# Patient Record
Sex: Male | Born: 1955 | Race: White | Hispanic: No | Smoking: Former smoker
Health system: Southern US, Community
[De-identification: ages and names within clinical notes are randomized; demographics above are authoritative.]

## PROBLEM LIST (undated history)

## (undated) DIAGNOSIS — F329 Major depressive disorder, single episode, unspecified: Secondary | ICD-10-CM

## (undated) DIAGNOSIS — T4145XA Adverse effect of unspecified anesthetic, initial encounter: Secondary | ICD-10-CM

## (undated) DIAGNOSIS — G629 Polyneuropathy, unspecified: Secondary | ICD-10-CM

## (undated) DIAGNOSIS — Z9889 Other specified postprocedural states: Secondary | ICD-10-CM

## (undated) DIAGNOSIS — I10 Essential (primary) hypertension: Secondary | ICD-10-CM

## (undated) DIAGNOSIS — G540 Brachial plexus disorders: Secondary | ICD-10-CM

## (undated) DIAGNOSIS — M1990 Unspecified osteoarthritis, unspecified site: Secondary | ICD-10-CM

## (undated) DIAGNOSIS — F32A Depression, unspecified: Secondary | ICD-10-CM

## (undated) DIAGNOSIS — F419 Anxiety disorder, unspecified: Secondary | ICD-10-CM

## (undated) DIAGNOSIS — I499 Cardiac arrhythmia, unspecified: Secondary | ICD-10-CM

## (undated) DIAGNOSIS — G709 Myoneural disorder, unspecified: Secondary | ICD-10-CM

## (undated) DIAGNOSIS — G4730 Sleep apnea, unspecified: Secondary | ICD-10-CM

## (undated) DIAGNOSIS — T8859XA Other complications of anesthesia, initial encounter: Secondary | ICD-10-CM

## (undated) DIAGNOSIS — R112 Nausea with vomiting, unspecified: Secondary | ICD-10-CM

## (undated) DIAGNOSIS — I4891 Unspecified atrial fibrillation: Secondary | ICD-10-CM

## (undated) DIAGNOSIS — D472 Monoclonal gammopathy: Secondary | ICD-10-CM

## (undated) HISTORY — PX: LAPAROSCOPIC GASTRIC SLEEVE RESECTION: SHX5895

## (undated) HISTORY — PX: CHOLECYSTECTOMY: SHX55

## (undated) HISTORY — DX: Unspecified atrial fibrillation: I48.91

## (undated) HISTORY — PX: COLONOSCOPY: SHX174

## (undated) HISTORY — PX: CERVICAL FUSION: SHX112

## (undated) HISTORY — PX: OTHER SURGICAL HISTORY: SHX169

## (undated) HISTORY — PX: BACK SURGERY: SHX140

## (undated) HISTORY — PX: ACHILLES TENDON REPAIR: SUR1153

## (undated) HISTORY — DX: Brachial plexus disorders: G54.0

## (undated) HISTORY — PX: LAMINECTOMY: SHX219

---
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
## 2012-05-22 DIAGNOSIS — M4800 Spinal stenosis, site unspecified: Secondary | ICD-10-CM | POA: Insufficient documentation

## 2012-05-22 DIAGNOSIS — I10 Essential (primary) hypertension: Secondary | ICD-10-CM | POA: Insufficient documentation

## 2012-05-22 DIAGNOSIS — G4733 Obstructive sleep apnea (adult) (pediatric): Secondary | ICD-10-CM | POA: Insufficient documentation

## 2012-06-09 DIAGNOSIS — I4819 Other persistent atrial fibrillation: Secondary | ICD-10-CM | POA: Insufficient documentation

## 2013-12-23 DIAGNOSIS — M4802 Spinal stenosis, cervical region: Secondary | ICD-10-CM | POA: Insufficient documentation

## 2016-04-09 DIAGNOSIS — Z79899 Other long term (current) drug therapy: Secondary | ICD-10-CM | POA: Insufficient documentation

## 2017-01-10 DIAGNOSIS — G894 Chronic pain syndrome: Secondary | ICD-10-CM | POA: Insufficient documentation

## 2017-12-05 DIAGNOSIS — G8929 Other chronic pain: Secondary | ICD-10-CM | POA: Insufficient documentation

## 2017-12-05 DIAGNOSIS — M25561 Pain in right knee: Secondary | ICD-10-CM | POA: Insufficient documentation

## 2018-01-13 ENCOUNTER — Other Ambulatory Visit: Payer: Self-pay | Admitting: Orthopedic Surgery

## 2018-01-15 ENCOUNTER — Inpatient Hospital Stay (HOSPITAL_COMMUNITY)
Admission: RE | Admit: 2018-01-15 | Discharge: 2018-01-15 | Disposition: A | Discharge status: Home / Self Care | Payer: Self-pay | Source: Ambulatory Visit

## 2018-01-15 NOTE — Pre-Procedure Instructions (Signed)
Cleofas Scadden  01/15/2018      [id number] - [place], [place] [address] AT [address] [place] [address] Phone: [phone] Fax: [phone]    Your procedure is scheduled on 01-27-2018 Monday .  Report to [facility] Admitting at  8:00 A.M.   Call this number if you have problems the morning of surgery:  [phone]   Remember:  Do not eat food or drink liquids after midnight.  Take these medicines the morning of surgery with A SIP OF WATER   Tylenol if needed  Oxycodone if needed for painSTOP TAKING ANY ASPIRIN(UNLESS OTHERWISE INSTRUCTED BY YOUR SURGEON),ANTIINFLAMATORIES (IBUPROFEN,ALEVE,MOTRIN,ADVIL,GOODY'S POWDERS),HERBAL SUPPLEMENTS,FISH OIL,AND VITAMINS 5-7 DAYS PRIOR TO SURGERY      Do not wear jewelry,.  Do not wear lotions, powders,  or deodorant.  Do not shave 48 hours prior to surgery.  Men may shave face and neck.   Do not bring valuables to the hospital.   [facility] is not responsible for any belongings or valuables.  Contacts, dentures or bridgework may not be worn into surgery.  Leave your suitcase in the car.  After surgery it may be brought to your room.  For patients admitted to the hospital, discharge time will be determined by your treatment team.  Patients discharged the day of surgery will not be allowed to drive home.    Special Instructions: [facility] - Preparing for Surgery  Before surgery, you can play an important role.  Because skin is not sterile, your skin needs to be as free of germs as possible.  You can reduce the number of germs on you skin by washing with CHG (chlorahexidine gluconate) soap before surgery.  CHG is an antiseptic cleaner which kills germs and bonds with the skin to continue killing germs even after washing.  Please DO NOT use if you have an allergy to CHG or antibacterial soaps.  If your skin becomes reddened/irritated stop using the CHG  and inform your nurse when you arrive at Short Stay.  Do not shave (including legs and underarms) for at least 48 hours prior to the first CHG shower.  You may shave your face.  Please follow these instructions carefully:   1.  Shower with CHG Soap the night before surgery and the   morning of Surgery.  2.  If you choose to wash your hair, wash your hair first as usual with your normal shampoo.  3.  After you shampoo, rinse your hair and body thoroughly to remove the  Shampoo.  4.  Use CHG as you would any other liquid soap.  You can apply chg directly  to the skin and wash gently with scrungie or a clean washcloth.  5.  Apply the CHG Soap to your body ONLY FROM THE NECK DOWN.   Do not use on open wounds or open sores.  Avoid contact with your eyes,  ears, mouth and genitals (private parts).  Wash genitals (private parts) with your normal soap.  6.  Wash thoroughly, paying special attention to the area where your surgery will be performed.  7.  Thoroughly rinse your body with warm water from the neck down.  8.  DO NOT shower/wash with your normal soap after using and rinsing o  the CHG Soap.  9.  Pat yourself dry with a clean towel.            10.  Wear clean pajamas.  11.  Place clean sheets on your bed the night of your first shower and do not sleep with pets.  Day of Surgery  Do not apply any lotions/deodorants the morning of surgery.  Please wear clean clothes to the hospital/surgery center.   Please read over the following fact sheets that you were given. MRSA Information and Surgical Site Infection Prevention,Incentive Spirometry

## 2018-01-16 ENCOUNTER — Other Ambulatory Visit: Payer: Self-pay

## 2018-01-16 ENCOUNTER — Encounter (HOSPITAL_COMMUNITY): Payer: Self-pay

## 2018-01-16 ENCOUNTER — Encounter (HOSPITAL_COMMUNITY)
Admission: RE | Admit: 2018-01-16 | Discharge: 2018-01-16 | Disposition: A | Discharge status: Home / Self Care | Payer: Medicare HMO | Source: Ambulatory Visit | Attending: Orthopedic Surgery | Admitting: Orthopedic Surgery

## 2018-01-16 DIAGNOSIS — Z01812 Encounter for preprocedural laboratory examination: Secondary | ICD-10-CM | POA: Diagnosis not present

## 2018-01-16 HISTORY — DX: Nausea with vomiting, unspecified: Z98.890

## 2018-01-16 HISTORY — DX: Cardiac arrhythmia, unspecified: I49.9

## 2018-01-16 HISTORY — DX: Depression, unspecified: F32.A

## 2018-01-16 HISTORY — DX: Essential (primary) hypertension: I10

## 2018-01-16 HISTORY — DX: Myoneural disorder, unspecified: G70.9

## 2018-01-16 HISTORY — DX: Unspecified osteoarthritis, unspecified site: M19.90

## 2018-01-16 HISTORY — DX: Sleep apnea, unspecified: G47.30

## 2018-01-16 HISTORY — DX: Major depressive disorder, single episode, unspecified: F32.9

## 2018-01-16 HISTORY — DX: Adverse effect of unspecified anesthetic, initial encounter: T41.45XA

## 2018-01-16 HISTORY — DX: Anxiety disorder, unspecified: F41.9

## 2018-01-16 HISTORY — DX: Other complications of anesthesia, initial encounter: T88.59XA

## 2018-01-16 HISTORY — DX: Nausea with vomiting, unspecified: R11.2

## 2018-01-16 LAB — COMPREHENSIVE METABOLIC PANEL
ALT: 28 U/L (ref 17–63)
AST: 32 U/L (ref 15–41)
Albumin: 3.9 g/dL (ref 3.5–5.0)
Alkaline Phosphatase: 54 U/L (ref 38–126)
Anion gap: 8 (ref 5–15)
BUN: 14 mg/dL (ref 6–20)
CALCIUM: 8.9 mg/dL (ref 8.9–10.3)
CO2: 29 mmol/L (ref 22–32)
Chloride: 102 mmol/L (ref 101–111)
Creatinine, Ser: 0.8 mg/dL (ref 0.61–1.24)
GFR calc non Af Amer: >60 mL/min (ref >60)
Glucose, Bld: 93 mg/dL (ref 65–99)
Potassium: 3.4 mmol/L — ABNORMAL LOW (ref 3.5–5.1)
SODIUM: 139 mmol/L (ref 135–145)
Total Bilirubin: 0.5 mg/dL (ref 0.3–1.2)
Total Protein: 7.8 g/dL (ref 6.5–8.1)

## 2018-01-16 LAB — CBC WITH DIFFERENTIAL/PLATELET
Basophils Absolute: 0 10*3/uL (ref 0.0–0.1)
Basophils Relative: 0 %
EOS ABS: 0.3 10*3/uL (ref 0.0–0.7)
Eosinophils Relative: 3 %
HCT: 42.8 % (ref 39.0–52.0)
Hemoglobin: 15 g/dL (ref 13.0–17.0)
LYMPHS ABS: 2.1 10*3/uL (ref 0.7–4.0)
Lymphocytes Relative: 25 %
MCH: 32.5 pg (ref 26.0–34.0)
MCHC: 35 g/dL (ref 30.0–36.0)
MCV: 92.8 fL (ref 78.0–100.0)
Monocytes Absolute: 0.7 10*3/uL (ref 0.1–1.0)
Monocytes Relative: 8 %
NEUTROS PCT: 64 %
Neutro Abs: 5.6 10*3/uL (ref 1.7–7.7)
Platelets: 257 10*3/uL (ref 150–400)
RBC: 4.61 MIL/uL (ref 4.22–5.81)
RDW: 12.2 % (ref 11.5–15.5)
WBC: 8.7 10*3/uL (ref 4.0–10.5)

## 2018-01-16 LAB — SURGICAL PCR SCREEN
MRSA, PCR: NEGATIVE
STAPHYLOCOCCUS AUREUS: NEGATIVE

## 2018-01-16 NOTE — Progress Notes (Signed)
PCP  Daryll Brod  MD   Cardiologist   Tally Due    MD  [facility] Has not seen in quite awhile per patient  Had Ablation for Atrial Fib,no problem since  Uses CPAP last sleep study greater than 5 years ago  No TED hose that will fit patient,message sent to Dr. Ronnie Derby

## 2018-01-17 NOTE — Progress Notes (Signed)
Case:  [id number] Date/Time:  01/27/18 0945   Procedure:  TOTAL KNEE ARTHROPLASTY (Left )   Anesthesia type:  Spinal   Pre-op diagnosis:  primary osteoarthritis left knee   Location:  [facility] OR ROOM 06 / [place] OR   Surgeon:  Vickey Huger, MD      DISCUSSION: Pt is a 62 year old male with hx afib s/p ablation (2013) with no reported recurrence   VS: BP (!) 141/80   Pulse 73   Temp 36.8 C   Resp 20   Ht 6\' 2"  (1.88 m)   Wt (!) 301 lb 14.4 oz (136.9 kg)   SpO2 95%   BMI 38.76 kg/m    PROVIDERS: Sun, Albert Yuan Yen, MD Cardiologist was Tally Due, MD in [facility].  Pt has not seen cardiology since afib ablation 2013 (notes in care everywhere)    LABS: Labs reviewed: Acceptable for surgery. (all labs ordered are listed, but only abnormal results are displayed)  Labs Reviewed  COMPREHENSIVE METABOLIC PANEL - Abnormal; Notable for the following components:      Result Value   Potassium 3.4 (*)    All other components within normal limits  SURGICAL PCR SCREEN  CBC WITH DIFFERENTIAL/PLATELET     EKG 01/16/18: NSR. LAD. RBBB. Inferior infarct, age undetermined. Appears stable when compared to prior EKG 07/05/09 (from [facility])   CV:  Echo 07/03/12 (care everywhere):  - There is trace mitral regurgitation. - There is physiologic tricuspid regurgitation. - There is no significant aortic or pulmonic regurgitation. - The estimated peak right ventricular systolic pressure is 31 mmHg based on an estimated or assumed right atrial pressure of 48mmHg. - Stenosis of the aortic, mitral, tricuspid, or pulmonic valves is not identified. - By Doppler color flow there are no intracardiac shunts identified. - Diastolic function of the left ventricle is indeterminate.   Past Medical History:  Diagnosis Date  . Anxiety   . Arthritis   . Complication of anesthesia   . Depression   . Dysrhythmia    a-Fib    ablation  . Hypertension   . Neuromuscular disorder (HCC)    neuropathy  both  legs and feet  . PONV (postoperative nausea and vomiting)    Severe  nausea  . Sleep apnea    CPAP     Past Surgical History:  Procedure Laterality Date  . ablation for atrial fib    . ACHILLES TENDON REPAIR    . arthroscopic knee Bilateral   . BACK SURGERY     times four  . CERVICAL FUSION    . LAPAROSCOPIC GASTRIC SLEEVE RESECTION      MEDICATIONS: . acetaminophen (TYLENOL) 500 MG tablet  . amLODipine (NORVASC) 5 MG tablet  . diclofenac (VOLTAREN) 75 MG EC tablet  . lisinopril-hydrochlorothiazide (PRINZIDE,ZESTORETIC) 20-12.5 MG tablet  . Oxycodone HCl 10 MG TABS  . predniSONE (DELTASONE) 5 MG tablet   No current facility-administered medications for this encounter.     If no changes, I anticipate pt can proceed with surgery as scheduled.   Willeen Cass, FNP-BC [facility] Short Stay Surgical Center/Anesthesiology Phone: [phone] 01/20/2018 10:20 AM

## 2018-01-24 MED ORDER — TRANEXAMIC ACID 1000 MG/10ML IV SOLN
1000.0000 mg | INTRAVENOUS | Status: AC
  Administered 2018-01-27: 1000 mg via INTRAVENOUS
  Filled 2018-01-24: qty 1100

## 2018-01-24 MED ORDER — BUPIVACAINE LIPOSOME 1.3 % IJ SUSP
20.0000 mL | INTRAMUSCULAR | Status: DC
  Filled 2018-01-24 (×2): qty 20

## 2018-01-24 MED ORDER — DEXTROSE 5 % IV SOLN
3.0000 g | INTRAVENOUS | Status: AC
  Administered 2018-01-27: 3 g via INTRAVENOUS
  Filled 2018-01-24: qty 3

## 2018-01-27 ENCOUNTER — Encounter (HOSPITAL_COMMUNITY)
Admission: AD | Disposition: A | Discharge status: Home / Self Care | Payer: Self-pay | Source: Ambulatory Visit | Attending: Orthopedic Surgery

## 2018-01-27 ENCOUNTER — Ambulatory Visit (HOSPITAL_COMMUNITY): Payer: Medicare HMO | Admitting: Emergency Medicine

## 2018-01-27 ENCOUNTER — Ambulatory Visit (HOSPITAL_COMMUNITY)
Admission: AD | Admit: 2018-01-27 | Discharge: 2018-01-29 | Disposition: A | Discharge status: Home / Self Care | Payer: Medicare HMO | Source: Ambulatory Visit | Attending: Orthopedic Surgery | Admitting: Orthopedic Surgery

## 2018-01-27 ENCOUNTER — Encounter (HOSPITAL_COMMUNITY): Payer: Self-pay | Admitting: Certified Registered"

## 2018-01-27 DIAGNOSIS — Z9989 Dependence on other enabling machines and devices: Secondary | ICD-10-CM | POA: Insufficient documentation

## 2018-01-27 DIAGNOSIS — Z885 Allergy status to narcotic agent status: Secondary | ICD-10-CM | POA: Insufficient documentation

## 2018-01-27 DIAGNOSIS — F329 Major depressive disorder, single episode, unspecified: Secondary | ICD-10-CM | POA: Diagnosis not present

## 2018-01-27 DIAGNOSIS — I10 Essential (primary) hypertension: Secondary | ICD-10-CM | POA: Insufficient documentation

## 2018-01-27 DIAGNOSIS — Z6838 Body mass index (BMI) 38.0-38.9, adult: Secondary | ICD-10-CM | POA: Insufficient documentation

## 2018-01-27 DIAGNOSIS — G4730 Sleep apnea, unspecified: Secondary | ICD-10-CM | POA: Diagnosis not present

## 2018-01-27 DIAGNOSIS — F17210 Nicotine dependence, cigarettes, uncomplicated: Secondary | ICD-10-CM | POA: Diagnosis not present

## 2018-01-27 DIAGNOSIS — I4891 Unspecified atrial fibrillation: Secondary | ICD-10-CM | POA: Diagnosis not present

## 2018-01-27 DIAGNOSIS — Z79899 Other long term (current) drug therapy: Secondary | ICD-10-CM | POA: Insufficient documentation

## 2018-01-27 DIAGNOSIS — F419 Anxiety disorder, unspecified: Secondary | ICD-10-CM | POA: Insufficient documentation

## 2018-01-27 DIAGNOSIS — M1712 Unilateral primary osteoarthritis, left knee: Secondary | ICD-10-CM | POA: Diagnosis not present

## 2018-01-27 DIAGNOSIS — G5783 Other specified mononeuropathies of bilateral lower limbs: Secondary | ICD-10-CM | POA: Diagnosis not present

## 2018-01-27 DIAGNOSIS — E669 Obesity, unspecified: Secondary | ICD-10-CM | POA: Diagnosis not present

## 2018-01-27 DIAGNOSIS — Z7982 Long term (current) use of aspirin: Secondary | ICD-10-CM | POA: Diagnosis not present

## 2018-01-27 DIAGNOSIS — Z96659 Presence of unspecified artificial knee joint: Secondary | ICD-10-CM

## 2018-01-27 HISTORY — PX: TOTAL KNEE ARTHROPLASTY: SHX125

## 2018-01-27 SURGERY — ARTHROPLASTY, KNEE, TOTAL
Anesthesia: General | Site: Knee | Laterality: Left

## 2018-01-27 MED ORDER — CHLORHEXIDINE GLUCONATE 4 % EX LIQD: 60.0000 mL | Freq: Once | CUTANEOUS | Status: DC

## 2018-01-27 MED ORDER — MIDAZOLAM HCL 2 MG/2ML IJ SOLN
INTRAMUSCULAR | Status: DC | PRN
  Administered 2018-01-27: 2 mg via INTRAVENOUS

## 2018-01-27 MED ORDER — LISINOPRIL 20 MG PO TABS
20.0000 mg | ORAL_TABLET | Freq: Every day | ORAL | Status: DC
  Administered 2018-01-28 – 2018-01-29 (×2): 20 mg via ORAL
  Filled 2018-01-27 (×2): qty 1

## 2018-01-27 MED ORDER — GABAPENTIN 300 MG PO CAPS
300.0000 mg | ORAL_CAPSULE | Freq: Three times a day (TID) | ORAL | Status: DC
  Administered 2018-01-27 – 2018-01-29 (×7): 300 mg via ORAL
  Filled 2018-01-27 (×7): qty 1

## 2018-01-27 MED ORDER — BISACODYL 5 MG PO TBEC: 5.0000 mg | DELAYED_RELEASE_TABLET | Freq: Every day | ORAL | Status: DC | PRN

## 2018-01-27 MED ORDER — PROPOFOL 10 MG/ML IV BOLUS
INTRAVENOUS | Status: AC
  Filled 2018-01-27: qty 20

## 2018-01-27 MED ORDER — FLEET ENEMA 7-19 GM/118ML RE ENEM: 1.0000 | ENEMA | Freq: Once | RECTAL | Status: DC | PRN

## 2018-01-27 MED ORDER — METOCLOPRAMIDE HCL 5 MG/ML IJ SOLN: 5.0000 mg | Freq: Three times a day (TID) | INTRAMUSCULAR | Status: DC | PRN

## 2018-01-27 MED ORDER — BUPIVACAINE-EPINEPHRINE (PF) 0.25% -1:200000 IJ SOLN: INTRAMUSCULAR | Status: DC | PRN

## 2018-01-27 MED ORDER — BUPIVACAINE LIPOSOME 1.3 % IJ SUSP
INTRAMUSCULAR | Status: DC | PRN
  Administered 2018-01-27: 20 mL

## 2018-01-27 MED ORDER — METOCLOPRAMIDE HCL 5 MG PO TABS: 5.0000 mg | ORAL_TABLET | Freq: Three times a day (TID) | ORAL | Status: DC | PRN

## 2018-01-27 MED ORDER — AMLODIPINE BESYLATE 5 MG PO TABS
5.0000 mg | ORAL_TABLET | Freq: Every day | ORAL | Status: DC
  Administered 2018-01-27 – 2018-01-28 (×2): 5 mg via ORAL
  Filled 2018-01-27 (×2): qty 1

## 2018-01-27 MED ORDER — MENTHOL 3 MG MT LOZG: 1.0000 | LOZENGE | OROMUCOSAL | Status: DC | PRN

## 2018-01-27 MED ORDER — OXYCODONE HCL 5 MG PO TABS
ORAL_TABLET | ORAL | Status: AC
  Filled 2018-01-27: qty 2

## 2018-01-27 MED ORDER — METHOCARBAMOL 500 MG PO TABS
500.0000 mg | ORAL_TABLET | Freq: Four times a day (QID) | ORAL | Status: DC | PRN
  Administered 2018-01-27 – 2018-01-28 (×4): 500 mg via ORAL
  Filled 2018-01-27 (×4): qty 1

## 2018-01-27 MED ORDER — SODIUM CHLORIDE 0.9 % IR SOLN
Status: DC | PRN
  Administered 2018-01-27: 1000 mL
  Administered 2018-01-27: 3000 mL

## 2018-01-27 MED ORDER — HYDROMORPHONE HCL 2 MG/ML IJ SOLN
INTRAMUSCULAR | Status: AC
  Filled 2018-01-27: qty 1

## 2018-01-27 MED ORDER — LACTATED RINGERS IV SOLN
INTRAVENOUS | Status: DC
  Administered 2018-01-27: 10 mL/h via INTRAVENOUS

## 2018-01-27 MED ORDER — FENTANYL CITRATE (PF) 250 MCG/5ML IJ SOLN
INTRAMUSCULAR | Status: AC
  Filled 2018-01-27: qty 5

## 2018-01-27 MED ORDER — ONDANSETRON HCL 4 MG/2ML IJ SOLN
INTRAMUSCULAR | Status: AC
  Filled 2018-01-27: qty 2

## 2018-01-27 MED ORDER — PROPOFOL 1000 MG/100ML IV EMUL
INTRAVENOUS | Status: AC
  Filled 2018-01-27: qty 100

## 2018-01-27 MED ORDER — METHYLNALTREXONE BROMIDE 150 MG PO TABS: 150.0000 mg | ORAL_TABLET | Freq: Every day | ORAL | Status: DC

## 2018-01-27 MED ORDER — TRANEXAMIC ACID 1000 MG/10ML IV SOLN
1000.0000 mg | Freq: Once | INTRAVENOUS | Status: AC
  Administered 2018-01-27: 1000 mg via INTRAVENOUS
  Filled 2018-01-27: qty 10

## 2018-01-27 MED ORDER — ONDANSETRON HCL 4 MG/2ML IJ SOLN
INTRAMUSCULAR | Status: DC | PRN
  Administered 2018-01-27: 4 mg via INTRAVENOUS

## 2018-01-27 MED ORDER — ONDANSETRON HCL 4 MG/2ML IJ SOLN: 4.0000 mg | Freq: Once | INTRAMUSCULAR | Status: DC | PRN

## 2018-01-27 MED ORDER — DEXAMETHASONE SODIUM PHOSPHATE 10 MG/ML IJ SOLN
INTRAMUSCULAR | Status: AC
  Filled 2018-01-27: qty 1

## 2018-01-27 MED ORDER — PREDNISONE 5 MG PO TABS
5.0000 mg | ORAL_TABLET | Freq: Every day | ORAL | Status: DC
  Administered 2018-01-28 – 2018-01-29 (×2): 5 mg via ORAL
  Filled 2018-01-27 (×2): qty 1

## 2018-01-27 MED ORDER — DEXAMETHASONE SODIUM PHOSPHATE 10 MG/ML IJ SOLN
10.0000 mg | Freq: Once | INTRAMUSCULAR | Status: AC
  Administered 2018-01-28: 10 mg via INTRAVENOUS
  Filled 2018-01-27: qty 1

## 2018-01-27 MED ORDER — MIDAZOLAM HCL 2 MG/2ML IJ SOLN
INTRAMUSCULAR | Status: AC
  Filled 2018-01-27: qty 2

## 2018-01-27 MED ORDER — FENTANYL CITRATE (PF) 100 MCG/2ML IJ SOLN
50.0000 ug | Freq: Once | INTRAMUSCULAR | Status: AC
  Administered 2018-01-27: 50 ug via INTRAVENOUS

## 2018-01-27 MED ORDER — CEFAZOLIN SODIUM-DEXTROSE 2-4 GM/100ML-% IV SOLN
2.0000 g | Freq: Four times a day (QID) | INTRAVENOUS | Status: AC
  Administered 2018-01-27 (×2): 2 g via INTRAVENOUS
  Filled 2018-01-27 (×2): qty 100

## 2018-01-27 MED ORDER — ALUM & MAG HYDROXIDE-SIMETH 200-200-20 MG/5ML PO SUSP
30.0000 mL | ORAL | Status: DC | PRN
  Administered 2018-01-28 – 2018-01-29 (×4): 30 mL via ORAL
  Filled 2018-01-27 (×4): qty 30

## 2018-01-27 MED ORDER — ONDANSETRON HCL 4 MG PO TABS
4.0000 mg | ORAL_TABLET | Freq: Four times a day (QID) | ORAL | Status: DC | PRN
Start: 2018-01-27 — End: 2018-01-29

## 2018-01-27 MED ORDER — ROCURONIUM BROMIDE 10 MG/ML (PF) SYRINGE
PREFILLED_SYRINGE | INTRAVENOUS | Status: AC
  Filled 2018-01-27: qty 5

## 2018-01-27 MED ORDER — ACETAMINOPHEN 500 MG PO TABS
1000.0000 mg | ORAL_TABLET | Freq: Four times a day (QID) | ORAL | Status: AC
  Administered 2018-01-27 – 2018-01-28 (×4): 1000 mg via ORAL
  Filled 2018-01-27 (×4): qty 2

## 2018-01-27 MED ORDER — MIDAZOLAM HCL 2 MG/2ML IJ SOLN
2.0000 mg | Freq: Once | INTRAMUSCULAR | Status: AC
  Administered 2018-01-27: 2 mg via INTRAVENOUS

## 2018-01-27 MED ORDER — ONDANSETRON HCL 4 MG/2ML IJ SOLN
4.0000 mg | Freq: Four times a day (QID) | INTRAMUSCULAR | Status: DC | PRN
  Administered 2018-01-27: 4 mg via INTRAVENOUS
  Filled 2018-01-27: qty 2

## 2018-01-27 MED ORDER — HYDROCHLOROTHIAZIDE 12.5 MG PO CAPS
12.5000 mg | ORAL_CAPSULE | Freq: Every day | ORAL | Status: DC
  Administered 2018-01-28 – 2018-01-29 (×2): 12.5 mg via ORAL
  Filled 2018-01-27 (×2): qty 1

## 2018-01-27 MED ORDER — ZOLPIDEM TARTRATE 5 MG PO TABS
5.0000 mg | ORAL_TABLET | Freq: Every evening | ORAL | Status: DC | PRN
  Filled 2018-01-27: qty 1

## 2018-01-27 MED ORDER — ACETAMINOPHEN 500 MG PO TABS
1000.0000 mg | ORAL_TABLET | Freq: Once | ORAL | Status: AC
  Administered 2018-01-27: 1000 mg via ORAL
  Filled 2018-01-27: qty 2

## 2018-01-27 MED ORDER — DOCUSATE SODIUM 100 MG PO CAPS
100.0000 mg | ORAL_CAPSULE | Freq: Two times a day (BID) | ORAL | Status: DC
  Administered 2018-01-27 – 2018-01-29 (×4): 100 mg via ORAL
  Filled 2018-01-27 (×4): qty 1

## 2018-01-27 MED ORDER — LIDOCAINE 2% (20 MG/ML) 5 ML SYRINGE
INTRAMUSCULAR | Status: DC | PRN
  Administered 2018-01-27: 100 mg via INTRAVENOUS

## 2018-01-27 MED ORDER — BUPIVACAINE-EPINEPHRINE 0.5% -1:200000 IJ SOLN
INTRAMUSCULAR | Status: DC | PRN
  Administered 2018-01-27: 30 mL

## 2018-01-27 MED ORDER — LIDOCAINE 2% (20 MG/ML) 5 ML SYRINGE
INTRAMUSCULAR | Status: AC
  Filled 2018-01-27: qty 5

## 2018-01-27 MED ORDER — METHOCARBAMOL 500 MG PO TABS
ORAL_TABLET | ORAL | Status: AC
  Filled 2018-01-27: qty 1

## 2018-01-27 MED ORDER — SUGAMMADEX SODIUM 200 MG/2ML IV SOLN
INTRAVENOUS | Status: DC | PRN
  Administered 2018-01-27: 273 mg via INTRAVENOUS

## 2018-01-27 MED ORDER — PROPOFOL 10 MG/ML IV BOLUS
INTRAVENOUS | Status: DC | PRN
  Administered 2018-01-27: 150 mg via INTRAVENOUS
  Administered 2018-01-27: 50 mg via INTRAVENOUS

## 2018-01-27 MED ORDER — PANTOPRAZOLE SODIUM 40 MG PO TBEC
40.0000 mg | DELAYED_RELEASE_TABLET | Freq: Every day | ORAL | Status: DC
  Administered 2018-01-28 – 2018-01-29 (×2): 40 mg via ORAL
  Filled 2018-01-27 (×2): qty 1

## 2018-01-27 MED ORDER — PROPOFOL 500 MG/50ML IV EMUL
INTRAVENOUS | Status: DC | PRN
  Administered 2018-01-27: 100 ug/kg/min via INTRAVENOUS

## 2018-01-27 MED ORDER — DIPHENHYDRAMINE HCL 12.5 MG/5ML PO ELIX: 12.5000 mg | ORAL_SOLUTION | ORAL | Status: DC | PRN

## 2018-01-27 MED ORDER — MEPERIDINE HCL 50 MG/ML IJ SOLN: 6.2500 mg | INTRAMUSCULAR | Status: DC | PRN

## 2018-01-27 MED ORDER — LISINOPRIL-HYDROCHLOROTHIAZIDE 20-12.5 MG PO TABS: 1.0000 | ORAL_TABLET | Freq: Two times a day (BID) | ORAL | Status: DC

## 2018-01-27 MED ORDER — ASPIRIN EC 325 MG PO TBEC
325.0000 mg | DELAYED_RELEASE_TABLET | Freq: Two times a day (BID) | ORAL | Status: DC
  Administered 2018-01-27 – 2018-01-29 (×4): 325 mg via ORAL
  Filled 2018-01-27 (×4): qty 1

## 2018-01-27 MED ORDER — GABAPENTIN 300 MG PO CAPS
300.0000 mg | ORAL_CAPSULE | Freq: Once | ORAL | Status: AC
  Administered 2018-01-27: 300 mg via ORAL
  Filled 2018-01-27: qty 1

## 2018-01-27 MED ORDER — HYDROMORPHONE HCL 2 MG/ML IJ SOLN
0.5000 mg | INTRAMUSCULAR | Status: DC | PRN
  Administered 2018-01-27 – 2018-01-28 (×5): 1 mg via INTRAVENOUS
  Filled 2018-01-27 (×5): qty 1

## 2018-01-27 MED ORDER — OXYCODONE HCL 5 MG PO TABS
10.0000 mg | ORAL_TABLET | ORAL | Status: DC | PRN
  Administered 2018-01-27 (×2): 15 mg via ORAL
  Administered 2018-01-27 – 2018-01-28 (×2): 10 mg via ORAL
  Administered 2018-01-28 – 2018-01-29 (×7): 15 mg via ORAL
  Filled 2018-01-27 (×10): qty 3

## 2018-01-27 MED ORDER — METHOCARBAMOL 1000 MG/10ML IJ SOLN
500.0000 mg | Freq: Four times a day (QID) | INTRAVENOUS | Status: DC | PRN
  Filled 2018-01-27: qty 5

## 2018-01-27 MED ORDER — SODIUM CHLORIDE 0.9 % IJ SOLN
INTRAMUSCULAR | Status: DC | PRN
  Administered 2018-01-27: 20 mL

## 2018-01-27 MED ORDER — PHENOL 1.4 % MT LIQD: 1.0000 | OROMUCOSAL | Status: DC | PRN

## 2018-01-27 MED ORDER — HYDROMORPHONE HCL 2 MG/ML IJ SOLN
0.2500 mg | INTRAMUSCULAR | Status: DC | PRN
  Administered 2018-01-27 (×4): 0.5 mg via INTRAVENOUS

## 2018-01-27 MED ORDER — DEXAMETHASONE SODIUM PHOSPHATE 10 MG/ML IJ SOLN
8.0000 mg | Freq: Once | INTRAMUSCULAR | Status: AC
  Administered 2018-01-27: 8 mg via INTRAVENOUS
  Filled 2018-01-27: qty 1

## 2018-01-27 MED ORDER — SENNOSIDES-DOCUSATE SODIUM 8.6-50 MG PO TABS: 1.0000 | ORAL_TABLET | Freq: Every evening | ORAL | Status: DC | PRN

## 2018-01-27 MED ORDER — ROPIVACAINE HCL 7.5 MG/ML IJ SOLN
INTRAMUSCULAR | Status: DC | PRN
  Administered 2018-01-27 (×2): 20 mL via PERINEURAL

## 2018-01-27 MED ORDER — FENTANYL CITRATE (PF) 100 MCG/2ML IJ SOLN
INTRAMUSCULAR | Status: DC | PRN
  Administered 2018-01-27: 50 ug via INTRAVENOUS
  Administered 2018-01-27: 250 ug via INTRAVENOUS
  Administered 2018-01-27: 100 ug via INTRAVENOUS

## 2018-01-27 MED ORDER — FENTANYL CITRATE (PF) 100 MCG/2ML IJ SOLN
INTRAMUSCULAR | Status: AC
  Filled 2018-01-27: qty 2

## 2018-01-27 MED ORDER — ROCURONIUM BROMIDE 10 MG/ML (PF) SYRINGE
PREFILLED_SYRINGE | INTRAVENOUS | Status: DC | PRN
  Administered 2018-01-27: 20 mg via INTRAVENOUS
  Administered 2018-01-27: 50 mg via INTRAVENOUS

## 2018-01-27 SURGICAL SUPPLY — 64 items
BANDAGE ACE 6X5 VEL STRL LF (GAUZE/BANDAGES/DRESSINGS) ×2 IMPLANT
BANDAGE ESMARK 6X9 LF (GAUZE/BANDAGES/DRESSINGS) ×1 IMPLANT
BLADE SAGITTAL 13X1.27X60 (BLADE) ×2 IMPLANT
BLADE SAW SGTL 83.5X18.5 (BLADE) ×2 IMPLANT
BLADE SURG 10 STRL SS (BLADE) ×6 IMPLANT
BNDG ESMARK 6X9 LF (GAUZE/BANDAGES/DRESSINGS) ×2
BOWL SMART MIX CTS (DISPOSABLE) ×2 IMPLANT
CEMENT BONE SIMPLEX SPEEDSET (Cement) ×4 IMPLANT
COMP FEM PERSONA STD SZ12 LT (Knees) ×2 IMPLANT
COMPONENT FEM PRNSA STD SZ12LT (Knees) ×1 IMPLANT
COVER SURGICAL LIGHT HANDLE (MISCELLANEOUS) ×2 IMPLANT
CUFF TOURNIQUET SINGLE 34IN LL (TOURNIQUET CUFF) ×2 IMPLANT
DRAPE EXTREMITY T 121X128X90 (DRAPE) ×2 IMPLANT
DRAPE HALF SHEET 40X57 (DRAPES) ×2 IMPLANT
DRAPE INCISE IOBAN 66X45 STRL (DRAPES) ×4 IMPLANT
DRAPE U-SHAPE 47X51 STRL (DRAPES) ×2 IMPLANT
DRESSING AQUACEL AG SP 3.5X10 (GAUZE/BANDAGES/DRESSINGS) ×1 IMPLANT
DRSG AQUACEL AG ADV 3.5X10 (GAUZE/BANDAGES/DRESSINGS) ×2 IMPLANT
DRSG AQUACEL AG SP 3.5X10 (GAUZE/BANDAGES/DRESSINGS) ×2
DURAPREP 26ML APPLICATOR (WOUND CARE) ×4 IMPLANT
ELECT REM PT RETURN 9FT ADLT (ELECTROSURGICAL) ×2
ELECTRODE REM PT RTRN 9FT ADLT (ELECTROSURGICAL) ×1 IMPLANT
GLOVE BIOGEL M 7.0 STRL (GLOVE) ×2 IMPLANT
GLOVE BIOGEL PI IND STRL 7.5 (GLOVE) ×1 IMPLANT
GLOVE BIOGEL PI IND STRL 8.5 (GLOVE) ×1 IMPLANT
GLOVE BIOGEL PI INDICATOR 7.5 (GLOVE) ×1
GLOVE BIOGEL PI INDICATOR 8.5 (GLOVE) ×1
GLOVE SURG ORTHO 8.0 STRL STRW (GLOVE) ×2 IMPLANT
GOWN STRL REUS W/ TWL LRG LVL3 (GOWN DISPOSABLE) ×1 IMPLANT
GOWN STRL REUS W/ TWL XL LVL3 (GOWN DISPOSABLE) ×1 IMPLANT
GOWN STRL REUS W/TWL 2XL LVL3 (GOWN DISPOSABLE) ×2 IMPLANT
GOWN STRL REUS W/TWL LRG LVL3 (GOWN DISPOSABLE) ×1
GOWN STRL REUS W/TWL XL LVL3 (GOWN DISPOSABLE) ×1
HANDPIECE INTERPULSE COAX TIP (DISPOSABLE) ×1
HOOD PEEL AWAY FACE SHEILD DIS (HOOD) ×6 IMPLANT
INSERT TIBIAL PLY L CD10-12X11 (Knees) ×2 IMPLANT
KIT BASIN OR (CUSTOM PROCEDURE TRAY) ×2 IMPLANT
KIT TURNOVER KIT B (KITS) ×2 IMPLANT
MANIFOLD NEPTUNE II (INSTRUMENTS) ×2 IMPLANT
NEEDLE 18GX1X1/2 (RX/OR ONLY) (NEEDLE) IMPLANT
NEEDLE 22X1 1/2 (OR ONLY) (NEEDLE) ×4 IMPLANT
NS IRRIG 1000ML POUR BTL (IV SOLUTION) ×2 IMPLANT
PACK TOTAL JOINT (CUSTOM PROCEDURE TRAY) ×2 IMPLANT
PAD ARMBOARD 7.5X6 YLW CONV (MISCELLANEOUS) ×4 IMPLANT
SET HNDPC FAN SPRY TIP SCT (DISPOSABLE) ×1 IMPLANT
STEM POLY PAT PLY 41M KNEE (Knees) ×2 IMPLANT
STEM TIBIA 5 DEG SZ H L KNEE (Knees) ×1 IMPLANT
STRIP CLOSURE SKIN 1/2X4 (GAUZE/BANDAGES/DRESSINGS) ×2 IMPLANT
SUCTION FRAZIER HANDLE 10FR (MISCELLANEOUS)
SUCTION TUBE FRAZIER 10FR DISP (MISCELLANEOUS) IMPLANT
SUT BONE WAX W31G (SUTURE) ×2 IMPLANT
SUT MNCRL AB 3-0 PS2 18 (SUTURE) ×2 IMPLANT
SUT VIC AB 0 CTB1 27 (SUTURE) ×2 IMPLANT
SUT VIC AB 1 CT1 27 (SUTURE) ×2
SUT VIC AB 1 CT1 27XBRD ANBCTR (SUTURE) ×2 IMPLANT
SUT VIC AB 2-0 CT1 27 (SUTURE) ×2
SUT VIC AB 2-0 CT1 TAPERPNT 27 (SUTURE) ×2 IMPLANT
SUT VLOC 180 0 24IN GS25 (SUTURE) ×2 IMPLANT
SYR 20CC LL (SYRINGE) ×4 IMPLANT
TIBIA STEM 5 DEG SZ H L KNEE (Knees) ×2 IMPLANT
TOWEL OR 17X24 6PK STRL BLUE (TOWEL DISPOSABLE) ×2 IMPLANT
TOWEL OR 17X26 10 PK STRL BLUE (TOWEL DISPOSABLE) ×2 IMPLANT
TRAY CATH 16FR W/PLASTIC CATH (SET/KITS/TRAYS/PACK) ×2 IMPLANT
WRAP KNEE MAXI GEL POST OP (GAUZE/BANDAGES/DRESSINGS) ×2 IMPLANT

## 2018-01-27 NOTE — Anesthesia Postprocedure Evaluation (Signed)
Anesthesia Post Note  Patient: Sean Arnold  Procedure(s) Performed: LEFT TOTAL KNEE ARTHROPLASTY (Left Knee)     Patient location during evaluation: PACU Anesthesia Type: General and Regional Level of consciousness: awake and alert Pain management: pain level controlled Vital Signs Assessment: post-procedure vital signs reviewed and stable Respiratory status: spontaneous breathing, nonlabored ventilation, respiratory function stable and patient connected to nasal cannula oxygen Cardiovascular status: blood pressure returned to baseline and stable Postop Assessment: no apparent nausea or vomiting Anesthetic complications: no    Last Vitals:  Vitals:   01/27/18 1445 01/27/18 1554  BP: (!) 151/78 (!) 152/82  Pulse: 74 81  Resp: 14 18  Temp: 36.8 C 36.9 C  SpO2: 97% 94%    Last Pain:  Vitals:   01/27/18 1554  TempSrc: Oral  PainSc:                  Ryan P Ellender

## 2018-01-27 NOTE — Anesthesia Preprocedure Evaluation (Addendum)
Anesthesia Evaluation  Patient identified by MRN, date of birth, ID band Patient awake    Reviewed: Allergy & Precautions, NPO status , Patient's Chart, lab work & pertinent test results  History of Anesthesia Complications (+) PONV  Airway Mallampati: II  TM Distance: >3 FB Neck ROM: Full    Dental  (+) Teeth Intact, Dental Advisory Given   Pulmonary sleep apnea , Current Smoker,    Pulmonary exam normal        Cardiovascular hypertension, Pt. on medications Normal cardiovascular exam+ dysrhythmias Atrial Fibrillation      Neuro/Psych Anxiety Depression    GI/Hepatic   Endo/Other    Renal/GU      Musculoskeletal   Abdominal   Peds  Hematology   Anesthesia Other Findings   Reproductive/Obstetrics                            Anesthesia Physical Anesthesia Plan  ASA: III  Anesthesia Plan: General   Post-op Pain Management:  Regional for Post-op pain   Induction: Intravenous  PONV Risk Score and Plan: 2 and Ondansetron, TIVA and Midazolam  Airway Management Planned: Oral ETT  Additional Equipment:   Intra-op Plan:   Post-operative Plan: Extubation in OR  Informed Consent: I have reviewed the patients History and Physical, chart, labs and discussed the procedure including the risks, benefits and alternatives for the proposed anesthesia with the patient or authorized representative who has indicated his/her understanding and acceptance.     Plan Discussed with: CRNA and Surgeon  Anesthesia Plan Comments:         Anesthesia Quick Evaluation

## 2018-01-27 NOTE — Evaluation (Signed)
Physical Therapy Evaluation Patient Details Name: Sean Arnold MRN: [id number] DOB: January 02, 1956 Today's Date: 01/27/2018   History of Present Illness  62 y.o. male admitted on 01/27/18 for elective L TKA.  Pt with significant PMH of bil LE neuropathy, HTN, depression, A-fib s/p ablation, cervical fusion/corepectomy, back surgery (multiple), achilles tendon repair, and bil knee arthroscopies (says he will have his right TKA in 6 weeks).    Clinical Impression  Pt is POD #0 and was able to move to EOB and get OOB to the recliner chair with assist.  He did report lightheadedness and nausea EOB, so I did not push gait tonight.  He has little to no help at d/c and would benefit from SNF for rehab prior to d/c back home alone.   PT to follow acutely for deficits listed below.       Follow Up Recommendations SNF;Supervision for mobility/OOB;Other (comment)([facility] is his preference)    Equipment Recommendations  None recommended by PT    Recommendations for Other Services   NA    Precautions / Restrictions Precautions Precautions: Knee Precaution Booklet Issued: Yes (comment) Precaution Comments: knee exercise handout given Restrictions Weight Bearing Restrictions: Yes LLE Weight Bearing: Weight bearing as tolerated      Mobility  Bed Mobility Overal bed mobility: Needs Assistance Bed Mobility: Supine to Sit     Supine to sit: Min assist;HOB elevated     General bed mobility comments: Min assist to help progress his left leg to EOB.   Transfers Overall transfer level: Needs assistance Equipment used: Rolling walker (2 wheeled) Transfers: Sit to/from Stand Sit to Stand: From elevated surface;Min guard         General transfer comment: Min assist to stand from significantly elevated bed.  Verbal cues for safe hand placement and assist to help his left leg when going to sitting.    Ambulation/Gait             General Gait Details: Pt lightheaded and nauseated EOB, so did  not attempt gait without a second person.           Balance Overall balance assessment: Needs assistance Sitting-balance support: Feet supported;No upper extremity supported;Bilateral upper extremity supported Sitting balance-Leahy Scale: Good Sitting balance - Comments: supervision EOB.    Standing balance support: Bilateral upper extremity supported Standing balance-Leahy Scale: Poor Standing balance comment: Able to stand and manipulate urinal in standing with min assist.                              Pertinent Vitals/Pain Pain Assessment: Faces Faces Pain Scale: Hurts whole lot Pain Location: left knee Pain Descriptors / Indicators: Grimacing;Guarding Pain Intervention(s): Limited activity within patient's tolerance;Monitored during session;Repositioned;Premedicated before session    Home Living Family/patient expects to be discharged to:: Private residence Living Arrangements: Alone Available Help at Discharge: Friend(s);Available PRN/intermittently Type of Home: House Home Access: Stairs to enter Entrance Stairs-Rails: Right Entrance Stairs-Number of Steps: 2 Home Layout: One level Home Equipment: Walker - 4 wheels;Shower seat - built in;Grab bars - tub/shower      Prior Function Level of Independence: Independent with assistive device(s)         Comments: Uses a rollator at times, retired Air traffic controller, drives, likes to use his HAM radio, wants to get back to cycling.     Hand Dominance   Dominant Hand: Right    Extremity/Trunk Assessment   Upper Extremity Assessment Upper Extremity Assessment:  LUE deficits/detail(h/o L5 nerve root injury when he had his cervical fusion) LUE Deficits / Details: biceps, deltoid weakness.    Lower Extremity Assessment Lower Extremity Assessment: LLE deficits/detail LLE Deficits / Details: left leg with normal post op pain and weakness.  ankle at least 3/5, knee 2/5, hip flexion 2+/5 LLE Sensation: history of  peripheral neuropathy(intact to LT above the knee)    Cervical / Trunk Assessment Cervical / Trunk Assessment: Other exceptions Cervical / Trunk Exceptions: h/o cervical spine surgery and multiple low back  Communication   Communication: No difficulties  Cognition Arousal/Alertness: Awake/alert Behavior During Therapy: WFL for tasks assessed/performed Overall Cognitive Status: Within Functional Limits for tasks assessed                                           Exercises Total Joint Exercises Ankle Circles/Pumps: AROM;Both;20 reps   Assessment/Plan    PT Assessment Patient needs continued PT services  PT Problem List Decreased strength;Decreased range of motion;Decreased activity tolerance;Decreased balance;Decreased mobility;Decreased knowledge of use of DME;Decreased knowledge of precautions;Pain;Obesity       PT Treatment Interventions DME instruction;Gait training;Stair training;Functional mobility training;Therapeutic activities;Therapeutic exercise;Balance training;Patient/family education;Manual techniques;Modalities    PT Goals (Current goals can be found in the Care Plan section)  Acute Rehab PT Goals Patient Stated Goal: to get back to cycling, lose weight. PT Goal Formulation: With patient Time For Goal Achievement: 02/10/18 Potential to Achieve Goals: Good    Frequency 7X/week   Barriers to discharge Decreased caregiver support         AM-PAC PT "6 Clicks" Daily Activity  Outcome Measure Difficulty turning over in bed (including adjusting bedclothes, sheets and blankets)?: Unable Difficulty moving from lying on back to sitting on the side of the bed? : Unable Difficulty sitting down on and standing up from a chair with arms (e.g., wheelchair, bedside commode, etc,.)?: Unable Help needed moving to and from a bed to chair (including a wheelchair)?: A Little Help needed walking in hospital room?: A Little Help needed climbing 3-5 steps with a  railing? : A Lot 6 Click Score: 11    End of Session   Activity Tolerance: Patient limited by pain;Other (comment)(by lightheadedness and nausea) Patient left: in chair;with call bell/phone within reach Nurse Communication: Mobility status PT Visit Diagnosis: Muscle weakness (generalized) (M62.81);Difficulty in walking, not elsewhere classified (R26.2);Pain Pain - Right/Left: Left Pain - part of body: Knee    Time: 1533-1630(pt was verbose, so did not charge for that time) PT Time Calculation (min) (ACUTE ONLY): 57 min   Charges:         Wells Guiles B. Dennie Vecchio, PT, DPT [phone]   PT Evaluation $PT Eval Moderate Complexity: 1 Mod PT Treatments $Therapeutic Activity: 23-37 mins   01/27/2018, 4:47 PM

## 2018-01-27 NOTE — Op Note (Signed)
TOTAL KNEE REPLACEMENT OPERATIVE NOTE:  01/27/2018  2:16 PM  PATIENT:  Sean Arnold  62 y.o. male  PRE-OPERATIVE DIAGNOSIS:  primary osteoarthritis left knee  POST-OPERATIVE DIAGNOSIS:  primary osteoarthritis left knee  PROCEDURE:  Procedure(s): LEFT TOTAL KNEE ARTHROPLASTY  SURGEON:  Surgeon(s): Vickey Huger, MD  PHYSICIAN ASSISTANT:  Nehemiah Massed, [facility]  ANESTHESIA:   general  DRAINS: Hemovac  SPECIMEN: None  COUNTS:  Correct  TOURNIQUET:   Total Tourniquet Time Documented: Thigh (Left) - 55 minutes Total: Thigh (Left) - 55 minutes   DICTATION:  Indication for procedure:    The patient is a 62 y.o. male who has failed conservative treatment for primary osteoarthritis left knee.  Informed consent was obtained prior to anesthesia. The risks versus benefits of the operation were explain and in a way the patient can, and did, understand.   On the implant demand matching protocol, this patient scored 8.  Therefore, this patient was not receive a polyethylene insert with vitamin E which is a high demand implant.  Description of procedure:     The patient was taken to the operating room and placed under anesthesia.  The patient was positioned in the usual fashion taking care that all body parts were adequately padded and/or protected.  I foley catheter was not placed.  A tourniquet was applied and the leg prepped and draped in the usual sterile fashion.  The extremity was exsanguinated with the esmarch and tourniquet inflated to 350 mmHg.  Pre-operative range of motion was normal.  The knee was in 5 degree of mild varus.  A midline incision approximately 6-7 inches long was made with a #10 blade.  A new blade was used to make a parapatellar arthrotomy going 2-3 cm into the quadriceps tendon, over the patella, and alongside the medial aspect of the patellar tendon.  A synovectomy was then performed with the #10 blade and forceps. I then elevated the deep MCL off the medial  tibial metaphysis subperiosteally around to the semimembranosus attachment.    I everted the patella and used calipers to measure patellar thickness.  I used the reamer to ream down to appropriate thickness to recreate the native thickness.  I then removed excess bone with the rongeur and sagittal saw.  I used the appropriately sized template and drilled the three lug holes.  I then put the trial in place and measured the thickness with the calipers to ensure recreation of the native thickness.  The trial was then removed and the patella subluxed and the knee brought into flexion.  A homan retractor was place to retract and protect the patella and lateral structures.  A Z-retractor was place medially to protect the medial structures.  The extra-medullary alignment system was used to make cut the tibial articular surface perpendicular to the anamotic axis of the tibia and in 3 degrees of posterior slope.  The cut surface and alignment jig was removed.  I then used the intramedullary alignment guide to make a 6 valgus cut on the distal femur.  I then marked out the epicondylar axis on the distal femur.  The posterior condylar axis measured 3 degrees.  I then used the anterior referencing sizer and measured the femur to be a size 12.  The 4-In-1 cutting block was screwed into place in external rotation matching the posterior condylar angle, making our cuts perpendicular to the epicondylar axis.  Anterior, posterior and chamfer cuts were made with the sagittal saw.  The cutting block and cut pieces  were removed.  A lamina spreader was placed in 90 degrees of flexion.  The ACL, PCL, menisci, and posterior condylar osteophytes were removed.  A 11 mm spacer blocked was found to offer good flexion and extension gap balance after mild in degree releasing.   The scoop retractor was then placed and the femoral finishing block was pinned in place.  The small sagittal saw was used as well as the lug drill to finish the  femur.  The block and cut surfaces were removed and the medullary canal hole filled with autograft bone from the cut pieces.  The tibia was delivered forward in deep flexion and external rotation.  A size H tray was selected and pinned into place centered on the medial 1/3 of the tibial tubercle.  The reamer and keel was used to prepare the tibia through the tray.    I then trialed with the size 12 femur, size H tibia, a 11 mm insert and the 41 patella.  I had excellent flexion/extension gap balance, excellent patella tracking.  Flexion was full and beyond 120 degrees; extension was zero.  These components were chosen and the staff opened them to me on the back table while the knee was lavaged copiously and the cement mixed.  The soft tissue was infiltrated with 60cc of exparel 1.3% through a 21 gauge needle.  I cemented in the components and removed all excess cement.  The polyethylene tibial component was snapped into place and the knee placed in extension while cement was hardening.  The capsule was infilltrated with 30cc of .25% Marcaine with epinephrine.  A hemovac was place in the joint exiting superolaterally.  A pain pump was place superomedially superficial to the arthrotomy.  Once the cement was hard, the tourniquet was let down.  Hemostasis was obtained.  The arthrotomy was closed with figure-8 #1 vicryl sutures.  The deep soft tissues were closed with #0 vicryls and the subcuticular layer closed with a running #2-0 vicryl.  The skin was reapproximated and closed with skin staples.  The wound was dressed with xeroform, 4 x4's, 2 ABD sponges, a single layer of webril and a TED stocking.   The patient was then awakened, extubated, and taken to the recovery room in stable condition.  BLOOD LOSS:  300cc DRAINS: 1 hemovac, 1 pain catheter COMPLICATIONS:  None.  PLAN OF CARE: Admit to inpatient   PATIENT DISPOSITION:  PACU - hemodynamically stable.   Delay start of Pharmacological VTE agent  (>24hrs) due to surgical blood loss or risk of bleeding:  not applicable  Please fax a copy of this op note to my office at [phone] (please only include page 1 and 2 of the Case Information op note)

## 2018-01-27 NOTE — Progress Notes (Signed)
Orthopedic Tech Progress Note Patient Details:  Sean Arnold Jan 27, 1956 [id number]  CPM Left Knee CPM Left Knee: On Left Knee Flexion (Degrees): 90 Left Knee Extension (Degrees): 0 Additional Comments: trapeze bar patient helper  Post Interventions Patient Tolerated: Well Instructions Provided: Care of device  Hildred Priest 01/27/2018, 2:16 PM Viewed order from doctor's order list

## 2018-01-27 NOTE — Anesthesia Procedure Notes (Signed)
Anesthesia Regional Block: Adductor canal block   Pre-Anesthetic Checklist: ,, timeout performed, Correct Patient, Correct Site, Correct Laterality, Correct Procedure, Correct Position, site marked, Risks and benefits discussed,  Surgical consent,  Pre-op evaluation,  At surgeon's request and post-op pain management  Laterality: Left  Prep: chloraprep       Needles:  Injection technique: Single-shot  Needle Type: Echogenic Stimulator Needle     Needle Length: 9cm  Needle Gauge: 21     Additional Needles:   Narrative:  Start time: 01/27/2018 9:50 AM End time: 01/27/2018 10:00 AM Injection made incrementally with aspirations every 5 mL.  Performed by: Personally  Anesthesiologist: Lillia Abed, MD  Additional Notes: Monitors applied. Patient sedated. Sterile prep and drape,hand hygiene and sterile gloves were used. Relevant anatomy identified.Needle position confirmed.Local anesthetic injected incrementally after negative aspiration. Local anesthetic spread visualized around nerve(s). Vascular puncture avoided. No complications. Image printed for medical record.The patient tolerated the procedure well.    Lillia Abed MD

## 2018-01-27 NOTE — Transfer of Care (Signed)
Immediate Anesthesia Transfer of Care Note  Patient: Sean Arnold  Procedure(s) Performed: LEFT TOTAL KNEE ARTHROPLASTY (Left Knee)  Patient Location: PACU  Anesthesia Type:General  Level of Consciousness: awake and oriented  Airway & Oxygen Therapy: Patient Spontanous Breathing and Patient connected to nasal cannula oxygen  Post-op Assessment: Report given to RN  Post vital signs: Reviewed and stable  Last Vitals:  Vitals Value Taken Time  BP 143/80 01/27/2018  1:37 PM  Temp    Pulse 77 01/27/2018  1:38 PM  Resp 19 01/27/2018  1:38 PM  SpO2 95 % 01/27/2018  1:38 PM  Vitals shown include unvalidated device data.  Last Pain:  Vitals:   01/27/18 0835  TempSrc:   PainSc: 7       Patients Stated Pain Goal: 5 (94/85/46 2703)  Complications: No apparent anesthesia complications

## 2018-01-27 NOTE — H&P (Signed)
Sean Arnold MRN:  [id number] DOB/SEX:  February 21, 1956/male  CHIEF COMPLAINT:  Painful left Knee  HISTORY: Patient is a 62 y.o. male presented with a history of pain in the left knee. Onset of symptoms was gradual starting a few years ago with gradually worsening course since that time. Patient has been treated conservatively with over-the-counter NSAIDs and activity modification. Patient currently rates pain in the knee at 10 out of 10 with activity. There is pain at night.  PAST MEDICAL HISTORY: There are no active problems to display for this patient.  Past Medical History:  Diagnosis Date  . Anxiety   . Arthritis   . Complication of anesthesia   . Depression   . Dysrhythmia    a-Fib    ablation  . Hypertension   . Neuromuscular disorder (HCC)    neuropathy  both legs and feet  . PONV (postoperative nausea and vomiting)    Severe  nausea  . Sleep apnea    CPAP    Past Surgical History:  Procedure Laterality Date  . ablation for atrial fib    . ACHILLES TENDON REPAIR    . arthroscopic knee Bilateral   . BACK SURGERY     times four  . CERVICAL FUSION    . LAPAROSCOPIC GASTRIC SLEEVE RESECTION       MEDICATIONS:   Medications Prior to Admission  Medication Sig Dispense Refill Last Dose  . acetaminophen (TYLENOL) 500 MG tablet Take 250 mg by mouth every 6 (six) hours as needed (for pain.).   01/26/2018 at Unknown time  . amLODipine (NORVASC) 5 MG tablet Take 5 mg by mouth at bedtime.   01/26/2018 at Unknown time  . diclofenac (VOLTAREN) 75 MG EC tablet Take 75 mg by mouth 2 (two) times daily as needed (for pain.).   week  . lisinopril-hydrochlorothiazide (PRINZIDE,ZESTORETIC) 20-12.5 MG tablet Take 1 tablet by mouth 2 (two) times daily.   01/27/2018 at Unknown time  . Methylnaltrexone Bromide (RELISTOR) 150 MG TABS Take 150 mg by mouth daily.   01/27/2018 at Unknown time  . Oxycodone HCl 10 MG TABS Take 10 mg by mouth 4 (four) times daily as needed (for pain.).   01/26/2018 at  Unknown time  . predniSONE (DELTASONE) 5 MG tablet Take 5 mg by mouth daily with breakfast.   01/27/2018 at Unknown time    ALLERGIES:   Allergies  Allergen Reactions  . Morphine And Related Anaphylaxis and Shortness Of Breath    Breathing Distress (patient states it was only because of large dose)    REVIEW OF SYSTEMS:  A comprehensive review of systems was negative except for: Musculoskeletal: positive for arthralgias and bone pain   FAMILY HISTORY:  No family history on file.  SOCIAL HISTORY:   Social History   Tobacco Use  . Smoking status: Current Some Day Smoker    Years: 40.00    Types: Cigarettes  . Smokeless tobacco: Never Used  Substance Use Topics  . Alcohol use: Yes    Comment: rarely     EXAMINATION:  Vital signs in last 24 hours: Temp:  [98.9 F (37.2 C)] 98.9 F (37.2 C) (05/20 0816) Pulse Rate:  [84] 84 (05/20 0816) Resp:  [18] 18 (05/20 0816) BP: (144)/(79) 144/79 (05/20 0816) SpO2:  [100 %] 100 % (05/20 0816) Weight:  [136.5 kg (301 lb)] 136.5 kg (301 lb) (05/20 0816)  BP (!) 144/79   Pulse 84   Temp 98.9 F (37.2 C) (Oral)   Resp 18  Ht 6\' 2"  (1.88 m)   Wt (!) 136.5 kg (301 lb)   SpO2 100%   BMI 38.65 kg/m   General Appearance:    Alert, cooperative, no distress, appears stated age  Head:    Normocephalic, without obvious abnormality, atraumatic  Eyes:    PERRL, conjunctiva/corneas clear, EOM's intact, fundi    benign, both eyes       Ears:    Normal TM's and external ear canals, both ears  Nose:   Nares normal, septum midline, mucosa normal, no drainage    or sinus tenderness  Throat:   Lips, mucosa, and tongue normal; teeth and gums normal  Neck:   Supple, symmetrical, trachea midline, no adenopathy;       thyroid:  No enlargement/tenderness/nodules; no carotid   bruit or JVD  Back:     Symmetric, no curvature, ROM normal, no CVA tenderness  Lungs:     Clear to auscultation bilaterally, respirations unlabored  Chest wall:    No  tenderness or deformity  Heart:    Regular rate and rhythm, S1 and S2 normal, no murmur, rub   or gallop  Abdomen:     Soft, non-tender, bowel sounds active all four quadrants,    no masses, no organomegaly  Genitalia:    Normal male without lesion, discharge or tenderness  Rectal:    Normal tone, normal prostate, no masses or tenderness;   guaiac negative stool  Extremities:   Extremities normal, atraumatic, no cyanosis or edema  Pulses:   2+ and symmetric all extremities  Skin:   Skin color, texture, turgor normal, no rashes or lesions  Lymph nodes:   Cervical, supraclavicular, and axillary nodes normal  Neurologic:   CNII-XII intact. Normal strength, sensation and reflexes      throughout    Musculoskeletal:  ROM 0-120, Ligaments intact,  Imaging Review Plain radiographs demonstrate severe degenerative joint disease of the left knee. The overall alignment is neutral. The bone quality appears to be good for age and reported activity level.  Assessment/Plan: Primary osteoarthritis, left knee   The patient history, physical examination and imaging studies are consistent with advanced degenerative joint disease of the left knee. The patient has failed conservative treatment.  The clearance notes were reviewed.  After discussion with the patient it was felt that Total Knee Replacement was indicated. The procedure,  risks, and benefits of total knee arthroplasty were presented and reviewed. The risks including but not limited to aseptic loosening, infection, blood clots, vascular injury, stiffness, patella tracking problems complications among others were discussed. The patient acknowledged the explanation, agreed to proceed with the procedure.  Preoperative templating of the joint replacement has been completed, documented, and submitted to the Operating Room personnel in order to optimize intra-operative equipment management.  Anticipated LOS equal to or greater than 2 midnights due to -  Age 67 and older with one or more of the following:  - Obesity  - Expected need for hospital services (PT, OT, Nursing) required for safe  discharge  - Anticipated need for postoperative skilled nursing care or inpatient rehab    Sean Arnold 01/27/2018, 9:13 AM

## 2018-01-27 NOTE — Anesthesia Procedure Notes (Signed)
Procedure Name: Intubation Date/Time: 01/27/2018 11:22 AM Performed by: Barrington Ellison, CRNA Pre-anesthesia Checklist: Patient identified, Emergency Drugs available, Suction available and Patient being monitored Patient Re-evaluated:Patient Re-evaluated prior to induction Oxygen Delivery Method: Circle System Utilized Preoxygenation: Pre-oxygenation with 100% oxygen Induction Type: IV induction Ventilation: Mask ventilation without difficulty Laryngoscope Size: Mac and 4 Grade View: Grade I Tube type: Oral Tube size: 7.5 mm Number of attempts: 1 Airway Equipment and Method: Stylet and Oral airway Placement Confirmation: ETT inserted through vocal cords under direct vision,  positive ETCO2 and breath sounds checked- equal and bilateral Secured at: 23 cm Tube secured with: Tape Dental Injury: Teeth and Oropharynx as per pre-operative assessment

## 2018-01-27 NOTE — Progress Notes (Signed)
Patient has home CPAP and places himself on and off without assistance. RT will monitor as needed.

## 2018-01-28 ENCOUNTER — Encounter (HOSPITAL_COMMUNITY): Payer: Self-pay | Admitting: Orthopedic Surgery

## 2018-01-28 DIAGNOSIS — M1712 Unilateral primary osteoarthritis, left knee: Secondary | ICD-10-CM | POA: Diagnosis not present

## 2018-01-28 LAB — CBC
HCT: 38.1 % — ABNORMAL LOW (ref 39.0–52.0)
HEMOGLOBIN: 13.2 g/dL (ref 13.0–17.0)
MCH: 31.9 pg (ref 26.0–34.0)
MCHC: 34.6 g/dL (ref 30.0–36.0)
MCV: 92 fL (ref 78.0–100.0)
PLATELETS: 245 10*3/uL (ref 150–400)
RBC: 4.14 MIL/uL — ABNORMAL LOW (ref 4.22–5.81)
RDW: 11.7 % (ref 11.5–15.5)
WBC: 15.9 10*3/uL — ABNORMAL HIGH (ref 4.0–10.5)

## 2018-01-28 LAB — BASIC METABOLIC PANEL
Anion gap: 10 (ref 5–15)
BUN: 13 mg/dL (ref 6–20)
CHLORIDE: 99 mmol/L — AB (ref 101–111)
CO2: 28 mmol/L (ref 22–32)
Calcium: 8.7 mg/dL — ABNORMAL LOW (ref 8.9–10.3)
Creatinine, Ser: 0.9 mg/dL (ref 0.61–1.24)
GFR calc Af Amer: >60 mL/min (ref >60)
GFR calc non Af Amer: >60 mL/min (ref >60)
GLUCOSE: 128 mg/dL — AB (ref 65–99)
POTASSIUM: 3.7 mmol/L (ref 3.5–5.1)
Sodium: 137 mmol/L (ref 135–145)

## 2018-01-28 MED ORDER — ASPIRIN 325 MG PO TBEC: 325.0000 mg | DELAYED_RELEASE_TABLET | Freq: Two times a day (BID) | ORAL | 0 refills | Status: DC

## 2018-01-28 MED ORDER — NALOXEGOL OXALATE 25 MG PO TABS
25.0000 mg | ORAL_TABLET | Freq: Every day | ORAL | Status: DC
  Administered 2018-01-28 – 2018-01-29 (×2): 25 mg via ORAL
  Filled 2018-01-28 (×2): qty 1

## 2018-01-28 MED ORDER — OXYCODONE HCL 15 MG PO TABS: 15.0000 mg | ORAL_TABLET | Freq: Four times a day (QID) | ORAL | 0 refills | Status: DC | PRN

## 2018-01-28 MED ORDER — METHOCARBAMOL 500 MG PO TABS: 500.0000 mg | ORAL_TABLET | Freq: Four times a day (QID) | ORAL | 0 refills | Status: DC | PRN

## 2018-01-28 MED ORDER — HYDROMORPHONE HCL 2 MG PO TABS: 2.0000 mg | ORAL_TABLET | ORAL | 0 refills | Status: DC | PRN

## 2018-01-28 NOTE — Plan of Care (Signed)
  Problem: Education: Goal: Knowledge of General Education information will improve Outcome: Progressing   Problem: Health Behavior/Discharge Planning: Goal: Ability to manage health-related needs will improve Outcome: Progressing   Problem: Activity: Goal: Risk for activity intolerance will decrease Outcome: Progressing   Problem: Coping: Goal: Level of anxiety will decrease Outcome: Progressing   Problem: Elimination: Goal: Will not experience complications related to bowel motility Outcome: Progressing   Problem: Pain Managment: Goal: General experience of comfort will improve Outcome: Progressing

## 2018-01-28 NOTE — Social Work (Signed)
CSW f/u with [facility] and they confirmed they can offer a SNF bed and patient would have out of pocket fees.  CSW then inquired if they are in network with [employer] and SNF indicated that they are not in network and will f/u on amount of out of pocket cost.  CSW then discussed with patient the $250 a day cost for SNF at [facility] as they are out of network. CSW then advsied that we can send to SNF's in network and patient indicated that he is concerned about the level of car at other SNF's. CSW validated his concerns.   CSW continued to discuss the disposition and will f/u as patient may return home with home options.  CSW will continue to follow.  Elissa Hefty, LCSW Clinical Social Worker [phone]

## 2018-01-28 NOTE — Progress Notes (Signed)
Rt at bedside to help pt with CPAP. Pt has a home CPAP.  Pt stated he doesn't need assistance with his home CPAP.

## 2018-01-28 NOTE — Clinical Social Work Note (Signed)
Clinical Social Work Assessment  Patient Details  Name: Sean Arnold MRN: [id number] Date of Birth: 08-22-1956  Date of referral:  01/28/18               Reason for consult:  Discharge Planning                Permission sought to share information with:  Case Manager Permission granted to share information::  No  Name::        Agency::  SNF  Relationship::     Contact Information:     Housing/Transportation Living arrangements for the past 2 months:  Single Family Home Source of Information:  Patient Patient Interpreter Needed:  None Criminal Activity/Legal Involvement Pertinent to Current Situation/Hospitalization:  No - Comment as needed Significant Relationships:  Other Family Members Lives with:  Self Do you feel safe going back to the place where you live?  (Uncertain at this time,  needs to see how he improves) Need for family participation in patient care:  No (Coment)  Care giving concerns:  Pt resides at home alone and rehabilitative therapy is recommending SNF.  Social Worker assessment / plan:  CSW met with patient at bedside to discuss disposition. Pt indicated that he is from home alone. His sister resides in [place] and cannot care for him and his other support system is in [place]. Pt is unsure if SNF will be the best option and is considering home as well. CSW explained SNF process, placement and insurance auth.  CSW obtained permission to send referral to [facility].  CSW will f/u on disposition.  Employment status:  Disabled (Comment on whether or not currently receiving Disability) Insurance information:  Managed Medicare PT Recommendations:  [place] / Referral to community resources:  [place]  Patient/Family's Response to care:  Patient thanked CSW for meeting and providing information on disposition. Pt hd some concerns about his care and CSW advised floor assistance director to f/u with patient on concerns.    Patient/Family's Understanding of and Emotional Response to Diagnosis, Current Treatment, and Prognosis:  Pt has good understanding of his impairment and appears to be an anxious state about the disposition. CSW validated his feelings and provided support as warranted. CSW will f/u on the insurance part as patient concerned with fees associated with SNF stay. CSW will f/u and get back to patient on same. The plan is that once patient decides on SNF vs home, then we can assist better with disposition.  Emotional Assessment Appearance:  Appears stated age Attitude/Demeanor/Rapport:  (Cooperative) Affect (typically observed):  Accepting, Appropriate Orientation:  Oriented to Situation, Oriented to  Time, Oriented to Place, Oriented to Self Alcohol / Substance use:  Not Applicable Psych involvement (Current and /or in the community):  No (Comment)  Discharge Needs  Concerns to be addressed:  Discharge Planning Concerns Readmission within the last 30 days:  No Current discharge risk:  Dependent with Mobility, Physical Impairment Barriers to Discharge:  No Barriers Identified   Normajean Baxter, LCSW 01/28/2018, 12:33 PM

## 2018-01-28 NOTE — Progress Notes (Signed)
Physical Therapy Treatment Patient Details Name: Sean Arnold MRN: [id number] DOB: 11-19-1955 Today's Date: 01/28/2018    History of Present Illness 62 y.o. male admitted on 01/27/18 for elective L TKA.  Pt with significant PMH of bil LE neuropathy, HTN, depression, A-fib s/p ablation, cervical fusion/corepectomy, back surgery (multiple), achilles tendon repair, and bil knee arthroscopies (says he will have his right TKA in 6 weeks).      PT Comments    Pt performed gait training this pm, he remains to present with deviations to his gait pattern but he is able to progress distance.  Posture remains flexed but appears to be his baseline.  Pt tolerated there ex with assistance for a few exercises.  Pt remains to report he has no support at home and is uncertain of d/c plans.  Pt will be seen in am, and I have placed an order for OT to consult patient before d/c.     Follow Up Recommendations  SNF;Supervision for mobility/OOB;Other (comment)([facility] is he preference.  )     Equipment Recommendations  None recommended by PT    Recommendations for Other Services       Precautions / Restrictions Precautions Precautions: Knee Precaution Booklet Issued: Yes (comment) Precaution Comments: knee exercise handout given Restrictions Weight Bearing Restrictions: Yes LLE Weight Bearing: Weight bearing as tolerated    Mobility  Bed Mobility Overal bed mobility: Needs Assistance Bed Mobility: Sit to Supine;Supine to Sit     Supine to sit: Supervision Sit to supine: Min assist   General bed mobility comments: Min assist to help progress his left leg back to bed.    Transfers Overall transfer level: Needs assistance Equipment used: Rolling walker (2 wheeled) Transfers: Sit to/from Stand Sit to Stand: Min guard         General transfer comment: Cues for hand placement, patient with decreased assistance from higher seated surface.    Ambulation/Gait Ambulation/Gait assistance: Min  guard Ambulation Distance (Feet): 200 Feet Assistive device: Rolling walker (2 wheeled) Gait Pattern/deviations: Step-to pattern;Antalgic;Trunk flexed     General Gait Details: pt with poor postural awareness, required cues for forward gaze.  Cues for  L heel strike and quad control.  Pt remains slow and guarded and unsure of his steps due to neuropathy.  Pt with noticeable IR of L hip when advancing steps forward.     Stairs             Wheelchair Mobility    Modified Rankin (Stroke Patients Only)       Balance Overall balance assessment: Needs assistance Sitting-balance support: Feet supported;No upper extremity supported;Bilateral upper extremity supported Sitting balance-Leahy Scale: Good Sitting balance - Comments: supervision EOB.      Standing balance-Leahy Scale: Poor Standing balance comment: Able to stand and manipulate urinal in standing with min assist.                             Cognition Arousal/Alertness: Awake/alert Behavior During Therapy: WFL for tasks assessed/performed Overall Cognitive Status: Within Functional Limits for tasks assessed                                        Exercises Total Joint Exercises Ankle Circles/Pumps: AROM;Both;20 reps;Supine Quad Sets: AROM;Left;10 reps;Supine Towel Squeeze: AROM;Both;10 reps;Supine Short Arc Quad: AROM;Left;10 reps;Supine Heel Slides: Left;10 reps;Supine;AAROM Hip ABduction/ADduction: AROM;Left;10 reps;Supine  Straight Leg Raises: AAROM;Left;10 reps;Supine Goniometric ROM: 79 degrees flexion in L knee.      General Comments        Pertinent Vitals/Pain Pain Assessment: 0-10 Pain Score: 6  Faces Pain Scale: Hurts whole lot Pain Location: left knee Pain Descriptors / Indicators: Grimacing;Guarding Pain Intervention(s): Monitored during session;Repositioned;Ice applied    Home Living                      Prior Function            PT Goals (current  goals can now be found in the care plan section) Acute Rehab PT Goals Patient Stated Goal: to get back to cycling, lose weight. Potential to Achieve Goals: Good Progress towards PT goals: Progressing toward goals    Frequency    7X/week      PT Plan Current plan remains appropriate    Co-evaluation              AM-PAC PT "6 Clicks" Daily Activity  Outcome Measure  Difficulty turning over in bed (including adjusting bedclothes, sheets and blankets)?: Unable Difficulty moving from lying on back to sitting on the side of the bed? : Unable Difficulty sitting down on and standing up from a chair with arms (e.g., wheelchair, bedside commode, etc,.)?: Unable Help needed moving to and from a bed to chair (including a wheelchair)?: A Little Help needed walking in hospital room?: A Little Help needed climbing 3-5 steps with a railing? : A Little 6 Click Score: 12    End of Session Equipment Utilized During Treatment: Gait belt Activity Tolerance: Patient limited by pain Patient left: in chair;with call bell/phone within reach Nurse Communication: Mobility status PT Visit Diagnosis: Muscle weakness (generalized) (M62.81);Difficulty in walking, not elsewhere classified (R26.2);Pain Pain - Right/Left: Left Pain - part of body: Knee     Time: 8315-1761 PT Time Calculation (min) (ACUTE ONLY): 26 min  Charges:  $Gait Training: 8-22 mins $Therapeutic Exercise: 8-22 mins                    G Codes:       Governor Rooks, PTA pager [phone]    Cristela Blue 01/28/2018, 4:57 PM

## 2018-01-28 NOTE — Progress Notes (Signed)
SPORTS MEDICINE AND JOINT REPLACEMENT  Lara Mulch, MD    Carlyon Shadow, PA-C [address], [place], [place]  [id number]                             [phone]   PROGRESS NOTE  Subjective:  negative for Chest Pain  negative for Shortness of Breath  negative for Nausea/Vomiting   negative for Calf Pain  negative for Bowel Movement   Tolerating Diet: yes         Patient reports pain as 3 on 0-10 scale.    Objective: Vital signs in last 24 hours:    Patient Vitals for the past 24 hrs:  BP Temp Temp src Pulse Resp SpO2 Height Weight  01/28/18 0402 (!) 159/79 98 F (36.7 C) Oral 82 19 100 % - -  01/27/18 2356 (!) 145/81 - - 79 17 96 % - -  01/27/18 2200 - - - 84 18 95 % - -  01/27/18 2108 120/77 98.6 F (37 C) Oral 87 18 96 % - -  01/27/18 1554 (!) 152/82 98.4 F (36.9 C) Oral 81 18 94 % - -  01/27/18 1445 (!) 151/78 98.2 F (36.8 C) - 74 14 97 % - -  01/27/18 1422 (!) 151/84 - - 70 15 97 % - -  01/27/18 1407 (!) 145/66 - - 73 17 97 % - -  01/27/18 1352 - - - 79 19 97 % - -  01/27/18 1351 (!) 151/72 - - 74 (!) 21 96 % - -  01/27/18 1339 (!) 143/80 97.6 F (36.4 C) - 79 14 95 % - -  01/27/18 1337 - - - 78 13 95 % - -  01/27/18 1020 140/80 - - - - - - -  01/27/18 1010 (!) 151/77 - - - - - - -  01/27/18 0816 (!) 144/79 98.9 F (37.2 C) Oral 84 18 100 % 6\' 2"  (1.88 m) (!) 136.5 kg (301 lb)    @flow {1959:LAST@   Intake/Output from previous day:   05/20 0701 - 05/21 0700 In: 2400 [P.O.:600; I.V.:1800] Out: 1000 [Urine:950]   Intake/Output this shift:   No intake/output data recorded.   Intake/Output      05/20 0701 - 05/21 0700 05/21 0701 - 05/22 0700   P.O. 600    I.V. (mL/kg) 1800 (13.2)    Total Intake(mL/kg) 2400 (17.6)    Urine (mL/kg/hr) 950    Blood 50    Total Output 1000    Net +1400            LABORATORY DATA: Recent Labs    01/28/18 0616  WBC 15.9*  HGB 13.2  HCT 38.1*  PLT 245   No results for input(s): NA, K, CL, CO2, BUN,  CREATININE, GLUCOSE, CALCIUM in the last 168 hours. No results found for: INR, PROTIME  Examination:  General appearance: alert, cooperative and no distress Extremities: extremities normal, atraumatic, no cyanosis or edema  Wound Exam: clean, dry, intact   Drainage:  None: wound tissue dry  Motor Exam: Quadriceps and Hamstrings Intact  Sensory Exam: Superficial Peroneal, Deep Peroneal and Tibial normal   Assessment:    1 Day Post-Op  Procedure(s) (LRB): LEFT TOTAL KNEE ARTHROPLASTY (Left)  ADDITIONAL DIAGNOSIS:  Active Problems:   S/P total knee replacement     Plan: Physical Therapy as ordered Weight Bearing as Tolerated (WBAT)  DVT Prophylaxis:  Aspirin  DISCHARGE  PLAN: Home  DISCHARGE NEEDS: HHPT   Patient is resting comfortably. Will follow today. Plan for D/C to SNF tomorrow  Anticipated LOS equal to or greater than 2 midnights due to - Age 61 and older with one or more of the following:  - Obesity  - Expected need for hospital services (PT, OT, Nursing) required for safe  discharge  - Anticipated need for postoperative skilled nursing care or inpatient rehab  - Active co-morbidities: None OR   - Unanticipated findings during/Post Surgery: None  - Patient is a high risk of re-admission due to: None           [name] 01/28/2018, 7:16 AM

## 2018-01-28 NOTE — Care Management CC44 (Signed)
Condition Code 44 Documentation Completed  Patient Details  Name: Cylan Borum MRN: [id number] Date of Birth: February 23, 1956   Condition Code 44 given:  Yes Patient signature on Condition Code 44 notice:  Yes Documentation of 2 MD's agreement:  Yes Code 44 added to claim:  Yes    Carles Collet, RN 01/28/2018, 1:49 PM

## 2018-01-28 NOTE — Progress Notes (Signed)
Physical Therapy Treatment Patient Details Name: Sean Arnold MRN: [id number] DOB: [birth year] Today's Date: 01/28/2018    History of Present Illness 62 y.o. male admitted on 01/27/18 for elective L TKA.  Pt with significant PMH of bil LE neuropathy, HTN, depression, A-fib s/p ablation, cervical fusion/corepectomy, back surgery (multiple), achilles tendon repair, and bil knee arthroscopies (says he will have his right TKA in 6 weeks).      PT Comments    Pt performed gait training and reviewed supine exercises.  Pt remains to require min assist due to mild knee instability in L knee during stance phase.  At this time patient is heavily reliant on UE support and requires constant looking at his position of his feet due to neuropathy.  Pt tolerated session well but remains to benefit from short term rehab to improve strength and functional mobility before returning home.    Follow Up Recommendations  SNF;Supervision for mobility/OOB;Other (comment)([facility] is his preference.  )     Equipment Recommendations  None recommended by PT    Recommendations for Other Services       Precautions / Restrictions Precautions Precautions: Knee Precaution Booklet Issued: Yes (comment) Precaution Comments: knee exercise handout given Restrictions Weight Bearing Restrictions: Yes LLE Weight Bearing: Weight bearing as tolerated    Mobility  Bed Mobility Overal bed mobility: Needs Assistance Bed Mobility: Sit to Supine       Sit to supine: Min assist   General bed mobility comments: Min assist to help progress his left leg back to bed.    Transfers Overall transfer level: Needs assistance Equipment used: Rolling walker (2 wheeled) Transfers: Sit to/from Stand Sit to Stand: Min assist         General transfer comment: Cues for hand placement and assistance to boost into standing from low bed height.    Ambulation/Gait Ambulation/Gait assistance: Min assist Ambulation Distance (Feet):  80 Feet Assistive device: Rolling walker (2 wheeled) Gait Pattern/deviations: Step-to pattern;Antalgic;Trunk flexed     General Gait Details: pt with poor postural awareness, required cues for forward gaze.  Cues for  L heel strike and quad control.  Pt remains slow and guarded and unsure of his steps due to neuropathy   Stairs             Wheelchair Mobility    Modified Rankin (Stroke Patients Only)       Balance Overall balance assessment: Needs assistance Sitting-balance support: Feet supported;No upper extremity supported;Bilateral upper extremity supported Sitting balance-Leahy Scale: Good Sitting balance - Comments: supervision EOB.      Standing balance-Leahy Scale: Poor                              Cognition Arousal/Alertness: Awake/alert Behavior During Therapy: WFL for tasks assessed/performed Overall Cognitive Status: Within Functional Limits for tasks assessed                                        Exercises Total Joint Exercises Ankle Circles/Pumps: AROM;Both;20 reps;Supine Quad Sets: AROM;Left;10 reps;Supine Towel Squeeze: AROM;Both;10 reps;Supine Short Arc Quad: AROM;Left;10 reps;Supine Heel Slides: Left;10 reps;Supine;AAROM Hip ABduction/ADduction: AROM;Left;10 reps;Supine Straight Leg Raises: AAROM;Left;10 reps;Supine Goniometric ROM: 79 degrees flexion in L knee.      General Comments        Pertinent Vitals/Pain Pain Assessment: 0-10 Faces Pain Scale: Hurts whole lot Pain  Location: left knee Pain Descriptors / Indicators: Grimacing;Guarding Pain Intervention(s): Monitored during session;Repositioned;Ice applied    Home Living                      Prior Function            PT Goals (current goals can now be found in the care plan section) Acute Rehab PT Goals Patient Stated Goal: to get back to cycling, lose weight. Potential to Achieve Goals: Good Progress towards PT goals: Progressing  toward goals    Frequency    7X/week      PT Plan Current plan remains appropriate    Co-evaluation              AM-PAC PT "6 Clicks" Daily Activity  Outcome Measure  Difficulty turning over in bed (including adjusting bedclothes, sheets and blankets)?: Unable Difficulty moving from lying on back to sitting on the side of the bed? : Unable Difficulty sitting down on and standing up from a chair with arms (e.g., wheelchair, bedside commode, etc,.)?: Unable Help needed moving to and from a bed to chair (including a wheelchair)?: A Little Help needed walking in hospital room?: A Little Help needed climbing 3-5 steps with a railing? : A Lot 6 Click Score: 11    End of Session Equipment Utilized During Treatment: Gait belt Activity Tolerance: Patient limited by pain Patient left: in chair;with call bell/phone within reach Nurse Communication: Mobility status PT Visit Diagnosis: Muscle weakness (generalized) (M62.81);Difficulty in walking, not elsewhere classified (R26.2);Pain Pain - Right/Left: Left Pain - part of body: Knee     Time: 7412-8786 PT Time Calculation (min) (ACUTE ONLY): 23 min  Charges:  $Gait Training: 8-22 mins $Therapeutic Exercise: 8-22 mins                    G Codes:       Governor Rooks, PTA pager [phone]    Cristela Blue 01/28/2018, 1:43 PM

## 2018-01-28 NOTE — Social Work (Signed)
CSW discussed case with RNCM and patient appears amenable to SNF-[facility].  CSW confirmed that SNF will start Insurance Auth for placement.  Elissa Hefty, LCSW Clinical Social Worker [phone]

## 2018-01-28 NOTE — NC FL2 (Signed)
[place] LEVEL OF CARE SCREENING TOOL     IDENTIFICATION  Patient Name: Sean Arnold Birthdate: 10-29-1955 Sex: male Admission Date (Current Location): 01/27/2018  [facility] and [place] Number:  Herbalist and Address:  [facility] [place], [place] [address] [place], [place] [address]      Provider Number: [id number]  Attending Physician Name and Address:  Vickey Huger, MD  Relative Name and Phone Number:  Adin Hector, sister, [phone]    Current Level of Care: Hospital Recommended Level of Care: [place] Prior Approval Number:    Date Approved/Denied:   PASRR Number: [id number]  Discharge Plan: SNF    Current Diagnoses: Patient Active Problem List   Diagnosis Date Noted  . S/P total knee replacement 01/27/2018    Orientation RESPIRATION BLADDER Height & Weight     Self, Time, Situation, Place  Normal Continent Weight: (!) 301 lb (136.5 kg) Height:  6\' 2"  (188 cm)  BEHAVIORAL SYMPTOMS/MOOD NEUROLOGICAL BOWEL NUTRITION STATUS      Continent Diet(See DC Summary)  AMBULATORY STATUS COMMUNICATION OF NEEDS Skin   Extensive Assist Verbally Surgical wounds                       Personal Care Assistance Level of Assistance  Bathing, Feeding, Dressing Bathing Assistance: Maximum assistance Feeding assistance: Limited assistance Dressing Assistance: Maximum assistance     Functional Limitations Info  Sight, Hearing, Speech Sight Info: Adequate Hearing Info: Adequate Speech Info: Adequate    SPECIAL CARE FACTORS FREQUENCY  PT (By licensed PT)     PT Frequency: 7x week              Contractures      Additional Factors Info  Code Status, Allergies Code Status Info: Full Allergies Info: MORPHINE AND RELATED            Current Medications (01/28/2018):  This is the current hospital active medication list Current Facility-Administered Medications  Medication Dose Route Frequency  Provider Last Rate Last Dose  . acetaminophen (TYLENOL) tablet 1,000 mg  1,000 mg Oral Q6H [name], [place]   1,000 mg at 01/28/18 2706  . alum & mag hydroxide-simeth (MAALOX/MYLANTA) 200-200-20 MG/5ML suspension 30 mL  30 mL Oral Q4H PRN [name], [place]      . amLODipine (NORVASC) tablet 5 mg  5 mg Oral QHS [name], [place]   5 mg at 01/27/18 2132  . aspirin EC tablet 325 mg  325 mg Oral BID [name], [place]   325 mg at 01/28/18 2376  . bisacodyl (DULCOLAX) EC tablet 5 mg  5 mg Oral Daily PRN [name], [place]      . diphenhydrAMINE (BENADRYL) 12.5 MG/5ML elixir 12.5-25 mg  12.5-25 mg Oral Q4H PRN [name], [place]      . docusate sodium (COLACE) capsule 100 mg  100 mg Oral BID [name], [place]   100 mg at 01/28/18 0857  . gabapentin (NEURONTIN) capsule 300 mg  300 mg Oral TID [name], [place]   300 mg at 01/28/18 0856  . lisinopril (PRINIVIL,ZESTRIL) tablet 20 mg  20 mg Oral Daily Vickey Huger, MD   20 mg at 01/28/18 2831   And  . hydrochlorothiazide (MICROZIDE) capsule 12.5 mg  12.5 mg Oral Daily Vickey Huger, MD   12.5 mg at 01/28/18 0904  . HYDROmorphone (DILAUDID) injection 0.5-1 mg  0.5-1 mg Intravenous Q4H  PRN [name], PA   1 mg at 01/28/18 1034  . menthol-cetylpyridinium (CEPACOL) lozenge 3 mg  1 lozenge Oral PRN [name], [place]       Or  . phenol [facility]) mouth spray 1 spray  1 spray Mouth/Throat PRN [name], [place]      . methocarbamol (ROBAXIN) tablet 500 mg  500 mg Oral Q6H PRN [name], [place]   500 mg at 01/28/18 0325   Or  . methocarbamol (ROBAXIN) 500 mg in dextrose 5 % 50 mL IVPB  500 mg Intravenous Q6H PRN [name], [place]      . Methylnaltrexone Bromide TABS 150 mg  150 mg Oral Daily [name], [place]      . metoCLOPramide [facility]) tablet 5-10 mg  5-10 mg Oral Q8H PRN [name], [place]       Or  . metoCLOPramide (REGLAN) injection 5-10 mg  5-10 mg Intravenous  Q8H PRN [name], [place]      . naloxegol oxalate (MOVANTIK) tablet 25 mg  25 mg Oral Daily [name], [place]   25 mg at 01/28/18 0102  . ondansetron (ZOFRAN) tablet 4 mg  4 mg Oral Q6H PRN [name], [place]       Or  . ondansetron [facility]) injection 4 mg  4 mg Intravenous Q6H PRN [name], [place]   4 mg at 01/27/18 2313  . oxyCODONE (Oxy IR/ROXICODONE) immediate release tablet 10-15 mg  10-15 mg Oral Q4H PRN [name], [place]   15 mg at 01/28/18 7253  . pantoprazole (PROTONIX) EC tablet 40 mg  40 mg Oral Daily [name], [place]   40 mg at 01/28/18 6644  . predniSONE (DELTASONE) tablet 5 mg  5 mg Oral Q breakfast [name], [place]   5 mg at 01/28/18 0856  . senna-docusate (Senokot-S) tablet 1 tablet  1 tablet Oral QHS PRN [name], PA      . sodium phosphate (FLEET) 7-19 GM/118ML enema 1 enema  1 enema Rectal Once PRN [name], PA      . zolpidem [facility]) tablet 5 mg  5 mg Oral QHS PRN,MR X 1 [name], [place]         Discharge Medications: Please see discharge summary for a list of discharge medications.  Relevant Imaging Results:  Relevant Lab Results:   Additional Information SS#: [id number]  [place], LCSW

## 2018-01-28 NOTE — Care Management Obs Status (Signed)
[place] NOTIFICATION   Patient Details  Name: Sean Arnold MRN: [id number] Date of Birth: 11/28/1955   Medicare Observation Status Notification Given:  Yes  Patient refused to sign. Patient stated that understood MOON.  Carles Collet, RN 01/28/2018, 1:49 PM

## 2018-01-29 DIAGNOSIS — M1712 Unilateral primary osteoarthritis, left knee: Secondary | ICD-10-CM | POA: Diagnosis not present

## 2018-01-29 LAB — CBC
HCT: 40.5 % (ref 39.0–52.0)
Hemoglobin: 14.1 g/dL (ref 13.0–17.0)
MCH: 31.9 pg (ref 26.0–34.0)
MCHC: 34.8 g/dL (ref 30.0–36.0)
MCV: 91.6 fL (ref 78.0–100.0)
PLATELETS: 249 10*3/uL (ref 150–400)
RBC: 4.42 MIL/uL (ref 4.22–5.81)
RDW: 11.8 % (ref 11.5–15.5)
WBC: 19 10*3/uL — ABNORMAL HIGH (ref 4.0–10.5)

## 2018-01-29 NOTE — Progress Notes (Signed)
Pain medication given to patient at this time. Urinal emptied- trash thrown away. Patient asked for break from bone foam- RN educated patient about keeping it on but he insisted it be removed. Warm blankets given, door closed. Will continue to monitor

## 2018-01-29 NOTE — Care Management Note (Signed)
Case Management Note  Patient Details  Name: Sean Arnold MRN: [id number] Date of Birth: 10-25-1955  Subjective/Objective:  62 yr old gentleman s/p left total knee arthroplasty.                  Action/Plan: Patient has decided to go home with Home Health. Patient has discussed discharge plan several times with Dr.Lucey. Lattie Haw, scheduler with Dr. Ronnie Derby, contacted [facility] with [facility] with referral for HHPT. DME has been delivered to patient's room. 01/30/18    Expected Discharge Date:                  Expected Discharge Plan:  [place]  In-House Referral:  NA  Discharge planning Services  CM Consult  Post Acute Care Choice:  Durable Medical Equipment, Home Health Choice offered to:  Patient  DME Arranged:  3-N-1, Walker rolling DME Agency:  [employer]  HH Arranged:  PT HH Agency:  [place]  Status of Service:  Completed, signed off  If discussed at [employer] of Stay Meetings, dates discussed:    Additional Comments:  Ninfa Meeker, RN 01/29/2018, 12:35 PM

## 2018-01-29 NOTE — Progress Notes (Signed)
Pt discharging home today. Reviewed d/c instructions with patient and he verbalized understanding. No further questions or concerns. Patient to discharge home via [place].

## 2018-01-29 NOTE — Progress Notes (Signed)
This RN took over patient's care at this time. Patient reported to RN that no one has been doing their job here at the hospital. He states that he did not know that he had to ask for his pain medications, nor was he aware that he had to ask for his SCD to be placed back on before bed. He also stated that he is to have a meeting in the morning with staff and the directors about teaching proper patient care so that he can feel safe while staying here. He then ended with a statement about taking his complaints to joint commission but he states that he does not want it to go that far.   This RN apologized for all complaints and asked that we move on to continue with a better shift.This RN then scanned the room for any trash- which was removed. Dirty blankets were thrown into the linen bags, gloves were refilled, and counter tops were cleaned. RN asked if patient needed anything to drink or eat. This RN also explained to patient the importance of using his IS and asking for pain medication because it is PRN, not scheduled. Patient stated that he understood. Patient was then given the next time that his pain medication would be due and he agreed to call out. Will continued to monitor and check on pain.   SCD, CPAP and bone foam are currently in use.

## 2018-01-29 NOTE — Discharge Summary (Signed)
SPORTS MEDICINE & JOINT REPLACEMENT   Lara Mulch, MD   Carlyon Shadow, PA-C [place], [place], [place]  [address]                             [phone]  PATIENT ID: Sean Arnold        MRN:  [id number]          DOB/AGE: 17-Apr-1956 / 62 y.o.    DISCHARGE SUMMARY  ADMISSION DATE:    01/27/2018 DISCHARGE DATE:   01/29/2018   ADMISSION DIAGNOSIS: primary osteoarthritis left knee    DISCHARGE DIAGNOSIS:  primary osteoarthritis left knee    ADDITIONAL DIAGNOSIS: Active Problems:   S/P total knee replacement  Past Medical History:  Diagnosis Date  . Anxiety   . Arthritis   . Complication of anesthesia   . Depression   . Dysrhythmia    a-Fib    ablation  . Hypertension   . Neuromuscular disorder (HCC)    neuropathy  both legs and feet  . PONV (postoperative nausea and vomiting)    Severe  nausea  . Sleep apnea    CPAP     PROCEDURE: Procedure(s): LEFT TOTAL KNEE ARTHROPLASTY on 01/27/2018  CONSULTS:    HISTORY:  See H&P in chart  HOSPITAL COURSE:  Melissa Pulido is a 62 y.o. admitted on 01/27/2018 and found to have a diagnosis of primary osteoarthritis left knee.  After appropriate laboratory studies were obtained  they were taken to the operating room on 01/27/2018 and underwent Procedure(s): LEFT TOTAL KNEE ARTHROPLASTY.   They were given perioperative antibiotics:  Anti-infectives (From admission, onward)   Start     Dose/Rate Route Frequency Ordered Stop   01/27/18 1700  ceFAZolin (ANCEF) IVPB 2g/100 mL premix     2 g 200 mL/hr over 30 Minutes Intravenous Every 6 hours 01/27/18 1500 01/28/18 0141   01/27/18 0830  ceFAZolin (ANCEF) 3 g in dextrose 5 % 50 mL IVPB     3 g 100 mL/hr over 30 Minutes Intravenous To ShortStay Surgical 01/24/18 1315 01/27/18 1109    .  Patient given tranexamic acid IV or topical and exparel intra-operatively.  Tolerated the procedure well.    POD# 1: Vital signs were stable.  Patient denied Chest pain, shortness of  breath, or calf pain.  Patient was started on Lovenox 30 mg subcutaneously twice daily at 8am.  Consults to PT, OT, and care management were made.  The patient was weight bearing as tolerated.  CPM was placed on the operative leg 0-90 degrees for 6-8 hours a day. When out of the CPM, patient was placed in the foam block to achieve full extension. Incentive spirometry was taught.  Dressing was changed.       POD #2, Continued  PT for ambulation and exercise program.  IV saline locked.  O2 discontinued.    The remainder of the hospital course was dedicated to ambulation and strengthening.   The patient was discharged on 2 Days Post-Op in  Good condition.  Blood products given:none  DIAGNOSTIC STUDIES: Recent vital signs:  Patient Vitals for the past 24 hrs:  BP Temp Temp src Pulse Resp SpO2  01/29/18 0537 138/75 97.8 F (36.6 C) Oral 76 17 100 %  01/28/18 2157 (!) 144/73 98.7 F (37.1 C) - 80 17 100 %  01/28/18 1455 132/86 99.5 F (37.5 C) Oral 96 20 100 %  Recent laboratory studies: Recent Labs    01/28/18 0616 01/29/18 0402  WBC 15.9* 19.0*  HGB 13.2 14.1  HCT 38.1* 40.5  PLT 245 249   Recent Labs    01/28/18 0616  NA 137  K 3.7  CL 99*  CO2 28  BUN 13  CREATININE 0.90  GLUCOSE 128*  CALCIUM 8.7*   No results found for: INR, PROTIME   Recent Radiographic Studies :  No results found.  DISCHARGE INSTRUCTIONS: Discharge Instructions    CPM   Complete by:  As directed    Continuous passive motion machine (CPM):      Use the CPM from 0 to 90 for 4-6 hours per day.      You may increase by 10 per day.  You may break it up into 2 or 3 sessions per day.      Use CPM for 2 weeks or until you are told to stop.   Call MD / Call 911   Complete by:  As directed    If you experience chest pain or shortness of breath, CALL 911 and be transported to the hospital emergency room.  If you develope a fever above 101 F, pus (white drainage) or increased drainage or redness  at the wound, or calf pain, call your surgeon's office.   Constipation Prevention   Complete by:  As directed    Drink plenty of fluids.  Prune juice may be helpful.  You may use a stool softener, such as Colace (over the counter) 100 mg twice a day.  Use MiraLax (over the counter) for constipation as needed.   Diet - low sodium heart healthy   Complete by:  As directed    Discharge instructions   Complete by:  As directed    INSTRUCTIONS AFTER JOINT REPLACEMENT   Remove items at home which could result in a fall. This includes throw rugs or furniture in walking pathways ICE to the affected joint every three hours while awake for 30 minutes at a time, for at least the first 3-5 days, and then as needed for pain and swelling.  Continue to use ice for pain and swelling. You may notice swelling that will progress down to the foot and ankle.  This is normal after surgery.  Elevate your leg when you are not up walking on it.   Continue to use the breathing machine you got in the hospital (incentive spirometer) which will help keep your temperature down.  It is common for your temperature to cycle up and down following surgery, especially at night when you are not up moving around and exerting yourself.  The breathing machine keeps your lungs expanded and your temperature down.   DIET:  As you were doing prior to hospitalization, we recommend a well-balanced diet.  DRESSING / WOUND CARE / SHOWERING  Keep the surgical dressing until follow up.  The dressing is water proof, so you can shower without any extra covering.  IF THE DRESSING FALLS OFF or the wound gets wet inside, change the dressing with sterile gauze.  Please use good hand washing techniques before changing the dressing.  Do not use any lotions or creams on the incision until instructed by your surgeon.    ACTIVITY  Increase activity slowly as tolerated, but follow the weight bearing instructions below.   No driving for 6 weeks or until  further direction given by your physician.  You cannot drive while taking narcotics.  No lifting or carrying  greater than 10 lbs. until further directed by your surgeon. Avoid periods of inactivity such as sitting longer than an hour when not asleep. This helps prevent blood clots.  You may return to work once you are authorized by your doctor.     WEIGHT BEARING   Weight bearing as tolerated with assist device (walker, cane, etc) as directed, use it as long as suggested by your surgeon or therapist, typically at least 4-6 weeks.   EXERCISES  Results after joint replacement surgery are often greatly improved when you follow the exercise, range of motion and muscle strengthening exercises prescribed by your doctor. Safety measures are also important to protect the joint from further injury. Any time any of these exercises cause you to have increased pain or swelling, decrease what you are doing until you are comfortable again and then slowly increase them. If you have problems or questions, call your caregiver or physical therapist for advice.   Rehabilitation is important following a joint replacement. After just a few days of immobilization, the muscles of the leg can become weakened and shrink (atrophy).  These exercises are designed to build up the tone and strength of the thigh and leg muscles and to improve motion. Often times heat used for twenty to thirty minutes before working out will loosen up your tissues and help with improving the range of motion but do not use heat for the first two weeks following surgery (sometimes heat can increase post-operative swelling).   These exercises can be done on a training (exercise) mat, on the floor, on a table or on a bed. Use whatever works the best and is most comfortable for you.    Use music or television while you are exercising so that the exercises are a pleasant break in your day. This will make your life better with the exercises acting as a  break in your routine that you can look forward to.   Perform all exercises about fifteen times, three times per day or as directed.  You should exercise both the operative leg and the other leg as well.   Exercises include:   Quad Sets - Tighten up the muscle on the front of the thigh (Quad) and hold for 5-10 seconds.   Straight Leg Raises - With your knee straight (if you were given a brace, keep it on), lift the leg to 60 degrees, hold for 3 seconds, and slowly lower the leg.  Perform this exercise against resistance later as your leg gets stronger.  Leg Slides: Lying on your back, slowly slide your foot toward your buttocks, bending your knee up off the floor (only go as far as is comfortable). Then slowly slide your foot back down until your leg is flat on the floor again.  Angel Wings: Lying on your back spread your legs to the side as far apart as you can without causing discomfort.  Hamstring Strength:  Lying on your back, push your heel against the floor with your leg straight by tightening up the muscles of your buttocks.  Repeat, but this time bend your knee to a comfortable angle, and push your heel against the floor.  You may put a pillow under the heel to make it more comfortable if necessary.   A rehabilitation program following joint replacement surgery can speed recovery and prevent re-injury in the future due to weakened muscles. Contact your doctor or a physical therapist for more information on knee rehabilitation.    CONSTIPATION  Constipation  is defined medically as fewer than three stools per week and severe constipation as less than one stool per week.  Even if you have a regular bowel pattern at home, your normal regimen is likely to be disrupted due to multiple reasons following surgery.  Combination of anesthesia, postoperative narcotics, change in appetite and fluid intake all can affect your bowels.   YOU MUST use at least one of the following options; they are listed in  order of increasing strength to get the job done.  They are all available over the counter, and you may need to use some, POSSIBLY even all of these options:    Drink plenty of fluids (prune juice may be helpful) and high fiber foods Colace 100 mg by mouth twice a day  Senokot for constipation as directed and as needed Dulcolax (bisacodyl), take with full glass of water  Miralax (polyethylene glycol) once or twice a day as needed.  If you have tried all these things and are unable to have a bowel movement in the first 3-4 days after surgery call either your surgeon or your primary doctor.    If you experience loose stools or diarrhea, hold the medications until you stool forms back up.  If your symptoms do not get better within 1 week or if they get worse, check with your doctor.  If you experience "the worst abdominal pain ever" or develop nausea or vomiting, please contact the office immediately for further recommendations for treatment.   ITCHING:  If you experience itching with your medications, try taking only a single pain pill, or even half a pain pill at a time.  You can also use Benadryl over the counter for itching or also to help with sleep.   TED HOSE STOCKINGS:  Use stockings on both legs until for at least 2 weeks or as directed by physician office. They may be removed at night for sleeping.  MEDICATIONS:  See your medication summary on the "After Visit Summary" that nursing will review with you.  You may have some home medications which will be placed on hold until you complete the course of blood thinner medication.  It is important for you to complete the blood thinner medication as prescribed.  PRECAUTIONS:  If you experience chest pain or shortness of breath - call 911 immediately for transfer to the hospital emergency department.   If you develop a fever greater that 101 F, purulent drainage from wound, increased redness or drainage from wound, foul odor from the  wound/dressing, or calf pain - CONTACT YOUR SURGEON.                                                   FOLLOW-UP APPOINTMENTS:  If you do not already have a post-op appointment, please call the office for an appointment to be seen by your surgeon.  Guidelines for how soon to be seen are listed in your "After Visit Summary", but are typically between 1-4 weeks after surgery.  OTHER INSTRUCTIONS:   Knee Replacement:  Do not place pillow under knee, focus on keeping the knee straight while resting. CPM instructions: 0-90 degrees, 2 hours in the morning, 2 hours in the afternoon, and 2 hours in the evening. Place foam block, curve side up under heel at all times except when in CPM or when walking.  DO NOT modify, tear, cut, or change the foam block in any way.  MAKE SURE YOU:  Understand these instructions.  Get help right away if you are not doing well or get worse.    Thank you for letting us be a part of your medical care team.  It is a privilege we respect greatly.  We hope these instructions will help you stay on track for a fast and full recovery!   Increase activity slowly as tolerated   Complete by:  As directed    TED hose   Complete by:  As directed    Use stockings (TED hose) for 2 weeks on both leg(s).  You may remove them at night for sleeping.      DISCHARGE MEDICATIONS:   Allergies as of 01/29/2018      Reactions   Morphine And Related Anaphylaxis, Shortness Of Breath   Breathing Distress (patient states it was only because of large dose)      Medication List    STOP taking these medications   diclofenac 75 MG EC tablet Commonly known as:  VOLTAREN     TAKE these medications   acetaminophen 500 MG tablet Commonly known as:  TYLENOL Take 250 mg by mouth every 6 (six) hours as needed (for pain.).   amLODipine 5 MG tablet Commonly known as:  NORVASC Take 5 mg by mouth at bedtime.   aspirin 325 MG EC tablet Take 1 tablet (325 mg total) by mouth 2 (two) times  daily.   HYDROmorphone 2 MG tablet Commonly known as:  DILAUDID Take 1 tablet (2 mg total) by mouth every 4 (four) hours as needed for severe pain (breakthrough pain).   lisinopril-hydrochlorothiazide 20-12.5 MG tablet Commonly known as:  PRINZIDE,ZESTORETIC Take 1 tablet by mouth 2 (two) times daily.   methocarbamol 500 MG tablet Commonly known as:  ROBAXIN Take 1-2 tablets (500-1,000 mg total) by mouth every 6 (six) hours as needed for muscle spasms.   oxyCODONE 15 MG immediate release tablet Commonly known as:  ROXICODONE Take 1 tablet (15 mg total) by mouth every 6 (six) hours as needed for moderate pain or severe pain. What changed:    medication strength  how much to take  when to take this  reasons to take this   predniSONE 5 MG tablet Commonly known as:  DELTASONE Take 5 mg by mouth daily with breakfast.   RELISTOR 150 MG Tabs Generic drug:  Methylnaltrexone Bromide Take 150 mg by mouth daily.            Durable Medical Equipment  (From admission, onward)        Start     Ordered   01/27/18 1500  DME Walker rolling  Once    Question:  Patient needs a walker to treat with the following condition  Answer:  S/P total knee replacement   01/27/18 1500   01/27/18 1500  DME 3 n 1  Once     01/27/18 1500   01/27/18 1500  DME Bedside commode  Once    Question:  Patient needs a bedside commode to treat with the following condition  Answer:  S/P total knee replacement   01/27/18 1500      FOLLOW UP VISIT:   Follow-up Information    [facility] Follow up.   Specialty:  [place] Why:  A representative from [facility] will contact you to arrange start date and time for your therapy. Contact information: [address] [address] [phone]           DISPOSITION: HOME VS. SNF  CONDITION:  Good   [name] 01/29/2018, 2:09 PM

## 2018-01-29 NOTE — Plan of Care (Signed)
  Problem: Education: Goal: Knowledge of General Education information will improve Outcome: Progressing   Problem: Activity: Goal: Risk for activity intolerance will decrease Outcome: Progressing   Problem: Coping: Goal: Level of anxiety will decrease Outcome: Progressing   Problem: Elimination: Goal: Will not experience complications related to bowel motility Outcome: Progressing   Problem: Pain Managment: Goal: General experience of comfort will improve Outcome: Progressing   

## 2018-01-29 NOTE — Evaluation (Signed)
Occupational Therapy Evaluation Patient Details Name: Sean Arnold MRN: [id number] DOB: 1956/04/18 Today's Date: 01/29/2018    History of Present Illness 62 y.o. male admitted on 01/27/18 for elective L TKA.  Pt with significant PMH of bil LE neuropathy, HTN, depression, A-fib s/p ablation, cervical fusion/corepectomy, back surgery (multiple), achilles tendon repair, and bil knee arthroscopies (says he will have his right TKA in 6 weeks).     Clinical Impression   Pt admitted as above and was seen for OT Assessment followed by ADL retraining session with focus on functional mobility, toilet and shower transfers, standing at sink during grooming. Extensive discussion/recommendations for performance of ADL's at d/c as pt was initially thinking SNF Rehab and will now be going home. He is currently supervision - Min guard A for LB ADL's and may benefit from further acute OT for LB dressing if he is still on floor next date.    Follow Up Recommendations  No OT follow up;Supervision - Intermittent    Equipment Recommendations  Other (comment)(Pt had 3:1 and RW delivered at end of OT session today. )    Recommendations for Other Services       Precautions / Restrictions Precautions Precautions: Knee Precaution Comments: Verbally reviewed Restrictions Weight Bearing Restrictions: Yes LLE Weight Bearing: Weight bearing as tolerated      Mobility Bed Mobility Overal bed mobility: (Sitting up at EOB upon OT arrival)                Transfers Overall transfer level: Needs assistance Equipment used: Rolling walker (2 wheeled) Transfers: Sit to/from [facility] Sit to Stand: Supervision;Min guard Stand pivot transfers: Supervision       General transfer comment: Pt requires decreased assistance from higher seated surface (ie 3:1)    Balance Overall balance assessment: Needs assistance Sitting-balance support: Feet supported;No upper extremity supported Sitting  balance-Leahy Scale: Good Sitting balance - Comments: Independent sitting up at EOB   Standing balance support: Bilateral upper extremity supported;No upper extremity supported;During functional activity Standing balance-Leahy Scale: Good Standing balance comment: Standing at sink during grooming - supervision-mod I level                           ADL either performed or assessed with clinical judgement   ADL Overall ADL's : Needs assistance/impaired Eating/Feeding: Independent;Sitting   Grooming: Wash/dry hands;Oral care;Standing;Supervision/safety   Upper Body Bathing: Sitting;Modified independent   Lower Body Bathing: Min guard;Sit to/from stand   Upper Body Dressing : Independent;Sitting   Lower Body Dressing: Sit to/from stand;Minimal assistance   Toilet Transfer: RW;Supervision/safety;Ambulation;Comfort height toilet;Grab bars   Toileting- Clothing Manipulation and Hygiene: Supervision/safety;Sit to/from stand;Sitting/lateral lean   Tub/ Shower Transfer: Walk-in shower;Supervision/safety;Ambulation;Shower seat;Rolling walker;Min guard(Simulated walkin shower transfer using foam in room. Pt able to step up/over using RW appropriately) Tub/Shower Transfer Details (indicate cue type and reason): Pt states that he has grab bar in shower to assist with transfer and built in shower seat that is low. Discussed and practiced using 3:1 for increased height and comfort. Pt also has hand held shower head. Functional mobility during ADLs: Supervision/safety;Rolling walker General ADL Comments: Pt was seen for OT Evaluaiton followed by participation in ADL retraining with focus on shower transfer, use of 3:1 in shower for increased height with supervision/min guard assist, use of hand held shower head; toilet transfer and grooming while standing at sink. Also discussed possibility of washing while sitting at sink if pt is more comfortable  with this at d/c as he expressed living  alone and that sister lives out of town. He plans to d/c home alone with HHPT per his report however his initial plan was to d/c to SNF Rehab. He expressed feeling that this was a decision made by MD and is now "Going with the plan". Lengthy discussion with pt following Pt expressing that he has been dissatisfied with overall care while in hospital related to plans for d/c, he lives alone w/o family nearby, that he didn't know that pain meds were PRN etc. However, he states that he has been speaking with care management about this.     Vision Baseline Vision/History: Wears glasses Wears Glasses: At all times Patient Visual Report: No change from baseline       Perception     Praxis      Pertinent Vitals/Pain Pain Assessment: 0-10 Pain Score: 8  Pain Location: left knee Pain Descriptors / Indicators: Aching;Guarding;Grimacing Pain Intervention(s): Limited activity within patient's tolerance;Monitored during session;RN gave pain meds during session;Repositioned;Ice applied     Hand Dominance Right   Extremity/Trunk Assessment Upper Extremity Assessment Upper Extremity Assessment: Overall WFL for tasks assessed;LUE deficits/detail(h/o L5 nerve root injury when he had cervical fusion) LUE Deficits / Details: biceps, deltoid weakness.   Lower Extremity Assessment Lower Extremity Assessment: Defer to PT evaluation       Communication Communication Communication: No difficulties   Cognition Arousal/Alertness: Awake/alert Behavior During Therapy: WFL for tasks assessed/performed Overall Cognitive Status: Within Functional Limits for tasks assessed                                     General Comments       Exercises     Shoulder Instructions      Home Living Family/patient expects to be discharged to:: Private residence Living Arrangements: Alone Available Help at Discharge: Friend(s);Available PRN/intermittently Type of Home: House Home Access: Stairs to  enter [employer] of Steps: 2 Entrance Stairs-Rails: Right Home Layout: One level     Bathroom Shower/Tub: Occupational psychologist: Handicapped height     Home Equipment: Environmental consultant - 4 wheels;Shower seat - built in;Grab bars - tub/shower          Prior Functioning/Environment Level of Independence: Independent with assistive device(s)        Comments: Uses a rollator at times, retired Air traffic controller, drives, likes to use his HAM radio, wants to get back to cycling.        OT Problem List: Decreased knowledge of use of DME or AE;Pain      OT Treatment/Interventions: Self-care/ADL training;DME and/or AE instruction;Therapeutic activities;Patient/family education    OT Goals(Current goals can be found in the care plan section) Acute Rehab OT Goals Patient Stated Goal: Get back to doing things again OT Goal Formulation: With patient Time For Goal Achievement: 02/12/18 Potential to Achieve Goals: Good  OT Frequency: Min 2X/week   Barriers to D/C:            Co-evaluation              AM-PAC PT "6 Clicks" Daily Activity     Outcome Measure Help from another person eating meals?: None Help from another person taking care of personal grooming?: A Little Help from another person toileting, which includes using toliet, bedpan, or urinal?: A Little Help from another person bathing (including washing, rinsing, drying)?: A  Little Help from another person to put on and taking off regular upper body clothing?: None Help from another person to put on and taking off regular lower body clothing?: A Little 6 Click Score: 20   End of Session Equipment Utilized During Treatment: Rolling walker CPM Left Knee CPM Left Knee: Off Nurse Communication: Other (comment)(RN gave pain meds during OT session)  Activity Tolerance: Patient tolerated treatment well;No increased pain Patient left: in chair;with call bell/phone within reach  OT Visit Diagnosis: Pain Pain -  Right/Left: Left Pain - part of body: Knee                Time: 3735-7897 OT Time Calculation (min): 54 min Charges:  OT General Charges $OT Visit: 1 Visit OT Evaluation $OT Eval Low Complexity: 1 Low(15 min) OT Treatments $Self Care/Home Management : 23-37 mins(25 min) G-Codes:      Josephine Igo Dixon, OTR/L 01/29/2018, 11:34 AM

## 2018-01-29 NOTE — Progress Notes (Signed)
Patient has verbalized to this nurse that he is unhappy with the care he has received at this hospital so far.When asked patient to explain .Patient stated that he was unaware he had to ask for pain medication when needed.Patient stated care has been inconsistent .Patient stated some nurses just bring pain medications in without asking if needed and others nurse's wait for me ask for pain medication.This nurse explained to patient that pain medication is as needed and he needed to ask for it. Patient verbalized understanding of this.Patient also complaining of not being placed in CPM and bone on day of admit.Empty cups and half can soda on bedside table.Asked patient what can be thrown away patient gave this nurse empty cups and half can soda to be thrown away.Then patient said I didn't mean to give you that soda can get it out the trash.This nurse told patient that I could not do that but would get him a new can soda and cup of ice.Asked patient if anything else needed before leaving room patient stated no.

## 2018-01-29 NOTE — Progress Notes (Signed)
Physical Therapy Treatment Patient Details Name: Sean Arnold MRN: [id number] DOB: 23-Feb-1956 Today's Date: 01/29/2018    History of Present Illness 62 y.o. male admitted on 01/27/18 for elective L TKA.  Pt with significant PMH of bil LE neuropathy, HTN, depression, A-fib s/p ablation, cervical fusion/corepectomy, back surgery (multiple), achilles tendon repair, and bil knee arthroscopies (says he will have his right TKA in 6 weeks).      PT Comments    Pt was able to walk a good distance down the hallway and go up and down stairs simulating home entry.  He would be safer if he had someone at home with him, but has decided to go home with limited help.  He no longer wants to pursue SNF level rehab at discharge.  PT to follow acutely until d/c confirmed.      Follow Up Recommendations  Follow surgeon's recommendation for DC plan and follow-up therapies;Supervision for mobility/OOB     Equipment Recommendations  None recommended by PT    Recommendations for Other Services   NA     Precautions / Restrictions Precautions Precautions: Knee Precaution Booklet Issued: Yes (comment) Precaution Comments: Verbally reviewed Restrictions LLE Weight Bearing: Weight bearing as tolerated    Mobility  Bed Mobility Overal bed mobility: (Sitting up at EOB upon OT arrival)             General bed mobility comments: Pt was seated EOB.   Transfers Overall transfer level: Needs assistance Equipment used: Rolling walker (2 wheeled) Transfers: Sit to/from Stand Sit to Stand: Supervision Stand pivot transfers: Supervision       General transfer comment: supervision for safety, but pt able to muscle his way up to stand without physical assist.   Ambulation/Gait Ambulation/Gait assistance: Min guard Ambulation Distance (Feet): 85 Feet Assistive device: Rolling walker (2 wheeled) Gait Pattern/deviations: Step-to pattern;Antalgic;Trunk flexed     General Gait Details: bil knee flexion  gait pattern with flexed trunk at hips, pt able to straighten up a little when cued, but likely pre morbid due to his back surgery and pain.     Stairs Stairs: Yes Stairs assistance: Min guard Stair Management: One rail Right;Step to pattern;Forwards Number of Stairs: 3 General stair comments: Pt educated on correct LE sequencing to get into the house.  He used both hands on one rail and went up forward.  I asked how he was going to get his walker up the stairs to and he informed me that the cab driver would help him into the house if he tipped him extra.             Balance Overall balance assessment: Needs assistance Sitting-balance support: Feet supported;No upper extremity supported Sitting balance-Leahy Scale: Good Sitting balance - Comments: Independent sitting up at EOB   Standing balance support: Bilateral upper extremity supported;No upper extremity supported;Single extremity supported Standing balance-Leahy Scale: Fair Standing balance comment: Standing at sink during grooming - supervision-mod I level                            Cognition Arousal/Alertness: Awake/alert Behavior During Therapy: WFL for tasks assessed/performed Overall Cognitive Status: Within Functional Limits for tasks assessed                                        Exercises Total Joint Exercises Long Arc Quad: AROM;Left;10  reps Knee Flexion: AROM;AAROM;Left;10 reps Goniometric ROM: 20-85        Pertinent Vitals/Pain Pain Assessment: Faces Faces Pain Scale: Hurts even more Pain Location: left knee Pain Descriptors / Indicators: Aching;Guarding;Grimacing Pain Intervention(s): Limited activity within patient's tolerance;Monitored during session;Repositioned;Ice applied           PT Goals (current goals can now be found in the care plan section) Acute Rehab PT Goals Patient Stated Goal: Get back to cycling again Progress towards PT goals: Progressing toward  goals    Frequency    7X/week      PT Plan Current plan remains appropriate       AM-PAC PT "6 Clicks" Daily Activity  Outcome Measure  Difficulty turning over in bed (including adjusting bedclothes, sheets and blankets)?: Unable Difficulty moving from lying on back to sitting on the side of the bed? : Unable Difficulty sitting down on and standing up from a chair with arms (e.g., wheelchair, bedside commode, etc,.)?: None Help needed moving to and from a bed to chair (including a wheelchair)?: A Little Help needed walking in hospital room?: A Little Help needed climbing 3-5 steps with a railing? : A Little 6 Click Score: 15    End of Session   Activity Tolerance: Patient limited by pain Patient left: in chair;with call bell/phone within reach   PT Visit Diagnosis: Muscle weakness (generalized) (M62.81);Difficulty in walking, not elsewhere classified (R26.2);Pain Pain - Right/Left: Left Pain - part of body: Knee     Time: 1221-1321(did not charge for chatting) PT Time Calculation (min) (ACUTE ONLY): 60 min  Charges:  $Gait Training: 8-22 mins $Therapeutic Exercise: 8-22 mins          Dorrie Cocuzza B. [place], [place], DPT [phone]            01/29/2018, 3:00 PM

## 2018-03-18 DIAGNOSIS — M7541 Impingement syndrome of right shoulder: Secondary | ICD-10-CM | POA: Insufficient documentation

## 2018-03-18 DIAGNOSIS — M25811 Other specified joint disorders, right shoulder: Secondary | ICD-10-CM | POA: Insufficient documentation

## 2018-05-07 DIAGNOSIS — M25511 Pain in right shoulder: Secondary | ICD-10-CM | POA: Insufficient documentation

## 2018-07-31 ENCOUNTER — Other Ambulatory Visit: Payer: Self-pay

## 2018-07-31 ENCOUNTER — Emergency Department (HOSPITAL_COMMUNITY)
Admission: EM | Admit: 2018-07-31 | Discharge: 2018-07-31 | Disposition: A | Discharge status: Home / Self Care | Payer: Medicare HMO | Attending: Emergency Medicine | Admitting: Emergency Medicine

## 2018-07-31 ENCOUNTER — Emergency Department (HOSPITAL_COMMUNITY): Payer: Medicare HMO

## 2018-07-31 DIAGNOSIS — Z79899 Other long term (current) drug therapy: Secondary | ICD-10-CM | POA: Diagnosis not present

## 2018-07-31 DIAGNOSIS — Z7982 Long term (current) use of aspirin: Secondary | ICD-10-CM | POA: Insufficient documentation

## 2018-07-31 DIAGNOSIS — F17210 Nicotine dependence, cigarettes, uncomplicated: Secondary | ICD-10-CM | POA: Insufficient documentation

## 2018-07-31 DIAGNOSIS — Z96652 Presence of left artificial knee joint: Secondary | ICD-10-CM | POA: Insufficient documentation

## 2018-07-31 DIAGNOSIS — R1013 Epigastric pain: Secondary | ICD-10-CM | POA: Diagnosis present

## 2018-07-31 DIAGNOSIS — I10 Essential (primary) hypertension: Secondary | ICD-10-CM | POA: Diagnosis not present

## 2018-07-31 LAB — COMPREHENSIVE METABOLIC PANEL
ALK PHOS: 46 U/L (ref 38–126)
ALT: 24 U/L (ref 0–44)
AST: 32 U/L (ref 15–41)
Albumin: 4.2 g/dL (ref 3.5–5.0)
Anion gap: 13 (ref 5–15)
BUN: 13 mg/dL (ref 8–23)
CALCIUM: 8.9 mg/dL (ref 8.9–10.3)
CO2: 24 mmol/L (ref 22–32)
Chloride: 101 mmol/L (ref 98–111)
Creatinine, Ser: 0.75 mg/dL (ref 0.61–1.24)
Glucose, Bld: 118 mg/dL — ABNORMAL HIGH (ref 70–99)
Potassium: 3.6 mmol/L (ref 3.5–5.1)
SODIUM: 138 mmol/L (ref 135–145)
Total Bilirubin: 0.7 mg/dL (ref 0.3–1.2)
Total Protein: 7.9 g/dL (ref 6.5–8.1)

## 2018-07-31 LAB — URINALYSIS, ROUTINE W REFLEX MICROSCOPIC
Bilirubin Urine: NEGATIVE
Glucose, UA: NEGATIVE mg/dL
Hgb urine dipstick: NEGATIVE
KETONES UR: NEGATIVE mg/dL
LEUKOCYTES UA: NEGATIVE
NITRITE: NEGATIVE
PROTEIN: NEGATIVE mg/dL
Specific Gravity, Urine: 1.023 (ref 1.005–1.030)
pH: 5 (ref 5.0–8.0)

## 2018-07-31 LAB — CBC
HCT: 45.9 % (ref 39.0–52.0)
Hemoglobin: 15.4 g/dL (ref 13.0–17.0)
MCH: 32.1 pg (ref 26.0–34.0)
MCHC: 33.6 g/dL (ref 30.0–36.0)
MCV: 95.6 fL (ref 80.0–100.0)
NRBC: 0 % (ref 0.0–0.2)
PLATELETS: 258 10*3/uL (ref 150–400)
RBC: 4.8 MIL/uL (ref 4.22–5.81)
RDW: 12.2 % (ref 11.5–15.5)
WBC: 12.4 10*3/uL — AB (ref 4.0–10.5)

## 2018-07-31 LAB — LIPASE, BLOOD: Lipase: 24 U/L (ref 11–51)

## 2018-07-31 NOTE — ED Triage Notes (Signed)
Pt reports onset of epigastric pain abd pain 4hrs ago. Pt report N/V x3. Pt is requesting 2mg  of dilaudid.

## 2018-07-31 NOTE — ED Provider Notes (Signed)
[facility] DEPT Provider Note: Georgena Spurling, MD, FACEP  CSN: [id number] MRN: [id number] ARRIVAL: 07/31/18 at [facility]: [facility]  Abdominal Pain   HISTORY OF PRESENT ILLNESS  07/31/18 1:40 AM Sean Arnold is a 62 y.o. male who is on chronic back pain management receiving 120, oxycodone 10 mg tablets monthly.   He is here with epigastric discomfort that began suddenly about 5 hours ago.  He describes it as feeling like he has a volleyball in his stomach.  He is status post gastric sleeve surgery.  It was initially severe and continued to be severe on arrival but is now much improved.  Pain is worse with movement or palpation.  He had associated nausea and vomiting.  He had very little emesis volume however.  He has chronic constipation for which he takes [employer], [employer].   He has had generalized bloating and pain and paresthesias in his left ulnar distribution since receiving a flu shot about a month ago.  He has chronic lower extremity neuropathy due to lumbar degenerative disc disease.   Past Medical History:  Diagnosis Date  . Anxiety   . Arthritis   . Complication of anesthesia   . Depression   . Dysrhythmia    a-Fib    ablation  . Hypertension   . Neuromuscular disorder (HCC)    neuropathy  both legs and feet  . PONV (postoperative nausea and vomiting)    Severe  nausea  . Sleep apnea    CPAP     Past Surgical History:  Procedure Laterality Date  . ablation for atrial fib    . ACHILLES TENDON REPAIR    . arthroscopic knee Bilateral   . BACK SURGERY     times four  . CERVICAL FUSION    . LAPAROSCOPIC GASTRIC SLEEVE RESECTION    . TOTAL KNEE ARTHROPLASTY Left 01/27/2018   Procedure: LEFT TOTAL KNEE ARTHROPLASTY;  Surgeon: Vickey Huger, MD;  Location: [facility];  Service: Orthopedics;  Laterality: Left;    No family history on file.  Social History   Tobacco Use  . Smoking status: Current Some Day Smoker    Years: 40.00    Types:  Cigarettes  . Smokeless tobacco: Never Used  Substance Use Topics  . Alcohol use: Yes    Comment: rarely  . Drug use: Never    Prior to Admission medications   Medication Sig Start Date End Date Taking? Authorizing Provider  acetaminophen (TYLENOL) 500 MG tablet Take 250 mg by mouth every 6 (six) hours as needed (for pain.).    [provider]  amLODipine (NORVASC) 5 MG tablet Take 5 mg by mouth at bedtime.    [provider]  aspirin EC 325 MG EC tablet Take 1 tablet (325 mg total) by mouth 2 (two) times daily. 01/28/18   [name], PA  HYDROmorphone (DILAUDID) 2 MG tablet Take 1 tablet (2 mg total) by mouth every 4 (four) hours as needed for severe pain (breakthrough pain). 01/28/18   [name], PA  lisinopril-hydrochlorothiazide (PRINZIDE,ZESTORETIC) 20-12.5 MG tablet Take 1 tablet by mouth 2 (two) times daily.    [provider]  methocarbamol (ROBAXIN) 500 MG tablet Take 1-2 tablets (500-1,000 mg total) by mouth every 6 (six) hours as needed for muscle spasms. 01/28/18   [name], PA  Methylnaltrexone Bromide (RELISTOR) 150 MG TABS Take 150 mg by mouth daily.    [provider]  oxyCODONE (ROXICODONE) 15  MG immediate release tablet Take 1 tablet (15 mg total) by mouth every 6 (six) hours as needed for moderate pain or severe pain. 01/28/18   [name], PA  predniSONE (DELTASONE) 5 MG tablet Take 5 mg by mouth daily with breakfast.    [provider]    Allergies Morphine and related   REVIEW OF SYSTEMS  Negative except as noted here or in the History of Present Illness.   PHYSICAL EXAMINATION  Initial Vital Signs Blood pressure (!) 165/114, pulse 70, temperature 97.8 F (36.6 C), resp. rate 15, SpO2 100 %.  Examination General: Well-developed, obese male in no acute distress; appearance consistent with age of record HENT: normocephalic; atraumatic Eyes: pupils equal, round and reactive to  light; extraocular muscles intact Neck: supple Heart: regular rate and rhythm; no murmurs, rubs or gallops Lungs: clear to auscultation bilaterally Abdomen: soft; nondistended; epigastric tenderness; bowel sounds present Extremities: No deformity; full range of motion; pulses normal; trace edema of lower legs Neurologic: Awake, alert and oriented; motor function intact in all extremities and symmetric; no facial droop Skin: Warm and dry Psychiatric: Normal mood and affect   RESULTS  Summary of this visit's results, reviewed by myself:   EKG Interpretation  Date/Time:  Thursday July 31 2018 00:36:06 EST Ventricular Rate:  73 PR Interval:    QRS Duration: 158 QT Interval:  431 QTC Calculation: 475 R Axis:   -39 Text Interpretation:  Sinus rhythm Right bundle branch block No significant change was found Confirmed by Isidoro Santillana, Jenny Reichmann [phone]) on 07/31/2018 1:08:23 AM      Laboratory Studies: Results for orders placed or performed during the hospital encounter of 07/31/18 (from the past 24 hour(s))  Lipase, blood     Status: None   Collection Time: 07/31/18 12:59 AM  Result Value Ref Range   Lipase 24 11 - 51 U/L  Comprehensive metabolic panel     Status: Abnormal   Collection Time: 07/31/18 12:59 AM  Result Value Ref Range   Sodium 138 135 - 145 mmol/L   Potassium 3.6 3.5 - 5.1 mmol/L   Chloride 101 98 - 111 mmol/L   CO2 24 22 - 32 mmol/L   Glucose, Bld 118 (H) 70 - 99 mg/dL   BUN 13 8 - 23 mg/dL   Creatinine, Ser 0.75 0.61 - 1.24 mg/dL   Calcium 8.9 8.9 - 10.3 mg/dL   Total Protein 7.9 6.5 - 8.1 g/dL   Albumin 4.2 3.5 - 5.0 g/dL   AST 32 15 - 41 U/L   ALT 24 0 - 44 U/L   Alkaline Phosphatase 46 38 - 126 U/L   Total Bilirubin 0.7 0.3 - 1.2 mg/dL   GFR calc non Af Amer >60 >60 mL/min   GFR calc Af Amer >60 >60 mL/min   Anion gap 13 5 - 15  CBC     Status: Abnormal   Collection Time: 07/31/18 12:59 AM  Result Value Ref Range   WBC 12.4 (H) 4.0 - 10.5 K/uL   RBC 4.80  4.22 - 5.81 MIL/uL   Hemoglobin 15.4 13.0 - 17.0 g/dL   HCT 45.9 39.0 - 52.0 %   MCV 95.6 80.0 - 100.0 fL   MCH 32.1 26.0 - 34.0 pg   MCHC 33.6 30.0 - 36.0 g/dL   RDW 12.2 11.5 - 15.5 %   Platelets 258 150 - 400 K/uL   nRBC 0.0 0.0 - 0.2 %  Urinalysis, Routine w reflex microscopic  Status: None   Collection Time: 07/31/18  1:00 AM  Result Value Ref Range   Color, Urine YELLOW YELLOW   APPearance CLEAR CLEAR   Specific Gravity, Urine 1.023 1.005 - 1.030   pH 5.0 5.0 - 8.0   Glucose, UA NEGATIVE NEGATIVE mg/dL   Hgb urine dipstick NEGATIVE NEGATIVE   Bilirubin Urine NEGATIVE NEGATIVE   Ketones, ur NEGATIVE NEGATIVE mg/dL   Protein, ur NEGATIVE NEGATIVE mg/dL   Nitrite NEGATIVE NEGATIVE   Leukocytes, UA NEGATIVE NEGATIVE   Imaging Studies: Dg Abd Acute W/chest  Result Date: 07/31/2018 CLINICAL DATA:  Sudden onset epigastric pain this morning. History of gastric bypass surgery. Nausea. EXAM: DG ABDOMEN ACUTE W/ 1V CHEST COMPARISON:  None. FINDINGS: Normal heart size and pulmonary vascularity. No focal airspace disease or consolidation in the lungs. No blunting of costophrenic angles. No pneumothorax. Mediastinal contours appear intact. Postoperative changes in the cervical spine. Multiple old left rib fractures. Gas and stool throughout the colon. No small or large bowel distention. No free intra-abdominal air. No abnormal air-fluid levels. No radiopaque stones. Degenerative changes in the spine and hips. Postoperative L3 through L5 lumbar laminectomies. Soft tissue contours appear intact. IMPRESSION: 1. No evidence of active pulmonary disease. 2. Nonobstructive bowel gas pattern. Electronically Signed   By: Lucienne Capers M.D.   On: 07/31/2018 02:39    ED COURSE and MDM  Nursing notes and initial vitals signs, including pulse oximetry, reviewed.  Vitals:   07/31/18 0028  BP: (!) 165/114  Pulse: 70  Resp: 15  Temp: 97.8 F (36.6 C)  SpO2: 100%   2:54 AM Patient's pain  continues to be mild and tolerable.  He was advised of results of diagnostic studies.  I do not believe further investigation is indicated at this time.  He was advised to return if symptoms worsen and we will consider a CT scan.  PROCEDURES    ED DIAGNOSES     ICD-10-CM   1. Epigastric pain R10.13        Rita Prom, Jenny Reichmann, MD 07/31/18 [phone]

## 2018-11-27 ENCOUNTER — Other Ambulatory Visit: Payer: Self-pay

## 2018-11-27 ENCOUNTER — Encounter: Payer: Self-pay | Admitting: Neurology

## 2018-11-27 ENCOUNTER — Ambulatory Visit: Payer: Medicare HMO | Admitting: Neurology

## 2018-11-27 DIAGNOSIS — G540 Brachial plexus disorders: Secondary | ICD-10-CM | POA: Insufficient documentation

## 2018-11-27 HISTORY — DX: Brachial plexus disorders: G54.0

## 2018-11-27 MED ORDER — GABAPENTIN 300 MG PO CAPS: 300.0000 mg | ORAL_CAPSULE | Freq: Three times a day (TID) | ORAL | 2 refills | Status: DC

## 2018-11-27 NOTE — Patient Instructions (Signed)
We will start gabapentin 300 mg twice a day for 1 week, then take one in the morning and 2 in the evening.   Neurontin (gabapentin) may result in drowsiness, ankle swelling, gait instability, or possibly dizziness. Please contact our office if significant side effects occur with this medication.

## 2018-11-27 NOTE — Progress Notes (Signed)
Reason for visit: Left arm pain and weakness  Referring physician: [name] is a 63 y.o. male  History of present illness:  Sean Arnold is a 63 year old right-handed white male with a history of chronic low back pain.  The patient is on chronic opiate medications for this.  The patient had a flu vaccination on 20 June 2018.  Within 2 days, he developed some discomfort around the left shoulder and then developed shortly thereafter some pain and numbness going down the arm into the hand, mainly affecting the ulnar aspect of the left hand.  The patient developed weakness of the arm and with the hand, he was unable to lift the arm higher than 60 degrees, he had difficulty with grip and he was dropping things.  The ability to raise the arm up has improved some since onset, but the weakness in the left hand has been persistent.  The patient has discomfort to the point where he is having difficulty sleeping at night.  He denies any issues with the right arm but claims that in 2018, a flu shot in the right arm resulted in similar but less severe symptoms.  The patient does have a history of a significant peripheral neuropathy associated with bilateral foot drops that has been present for over 10 years.  The patient has severe numbness from the knees down, he has had some problems with balance, he has not had any falls.  He denies issues controlling the bowels or the bladder but he does have some constipation while on the opiate medications.  The patient is had cervical spine surgery at the C5-6 level as well in the past.  He denies neck pain at this point.  He is sent to this office for an evaluation.  Past Medical History:  Diagnosis Date  . A-fib ([facility])   . Anxiety   . Arthritis   . 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 Complication of anesthesia   . Depression   . Dysrhythmia    a-Fib    ablation  . Hypertension   . Neuromuscular disorder (HCC)    neuropathy  both legs and  feet  . PONV (postoperative nausea and vomiting)    Severe  nausea  . Sleep apnea    CPAP     Past Surgical History:  Procedure Laterality Date  . ablation for atrial fib    . ACHILLES TENDON REPAIR    . arthroscopic knee Bilateral   . BACK SURGERY     times four  . CERVICAL FUSION    . LAMINECTOMY    . LAPAROSCOPIC GASTRIC SLEEVE RESECTION    . TOTAL KNEE ARTHROPLASTY Left 01/27/2018   Procedure: LEFT TOTAL KNEE ARTHROPLASTY;  Surgeon: Vickey Huger, MD;  Location: [facility];  Service: Orthopedics;  Laterality: Left;    Family History  Problem Relation Age of Onset  . GI problems Mother   . Pneumonia Father   . Atrial fibrillation Father     Social history:  reports that he has been smoking cigarettes. He has smoked for the past 40.00 years. He has never used smokeless tobacco. He reports current alcohol use. He reports that he does not use drugs.  Medications:  Prior to Admission medications   Medication Sig Start Date End Date Taking? Authorizing Provider  acetaminophen (TYLENOL) 500 MG tablet Take 250 mg by mouth every 6 (six) hours as needed (for pain.).    [provider]  amLODipine (  NORVASC) 5 MG tablet Take 5 mg by mouth at bedtime.    [provider]  aspirin EC 325 MG EC tablet Take 1 tablet (325 mg total) by mouth 2 (two) times daily. 01/28/18   [name], PA  HYDROmorphone (DILAUDID) 2 MG tablet Take 1 tablet (2 mg total) by mouth every 4 (four) hours as needed for severe pain (breakthrough pain). 01/28/18   [name], PA  lisinopril-hydrochlorothiazide (PRINZIDE,ZESTORETIC) 20-12.5 MG tablet Take 1 tablet by mouth 2 (two) times daily.    [provider]  methocarbamol (ROBAXIN) 500 MG tablet Take 1-2 tablets (500-1,000 mg total) by mouth every 6 (six) hours as needed for muscle spasms. 01/28/18   [name], PA  Methylnaltrexone Bromide (RELISTOR) 150 MG TABS Take 150 mg by mouth daily.    [provider]   oxyCODONE (ROXICODONE) 15 MG immediate release tablet Take 1 tablet (15 mg total) by mouth every 6 (six) hours as needed for moderate pain or severe pain. 01/28/18   [name], PA  predniSONE (DELTASONE) 5 MG tablet Take 5 mg by mouth daily with breakfast.    [provider]      Allergies  Allergen Reactions  . Morphine And Related Anaphylaxis and Shortness Of Breath    Breathing Distress (patient states it was only because of large dose)    ROS:  Out of a complete 14 system review of symptoms, the patient complains only of the following symptoms, and all other reviewed systems are negative.  Weight gain, fatigue Palpitations of the heart Constipation Joint pain, aching muscles Numbness, weakness Anxiety, depression, none of sleep, decreased energy, disinterest in activities Insomnia, sleeplessness  Height 6\' 2"  (1.88 m), weight (!) 312 lb (141.5 kg).  Physical Exam  General: The patient is alert and cooperative at the time of the examination.  The patient is markedly obese.  Eyes: Pupils are equal, round, and reactive to light. Discs are flat bilaterally.  Neck: The neck is supple, no carotid bruits are noted.  Respiratory: The respiratory examination is clear.  Cardiovascular: The cardiovascular examination reveals a regular rate and rhythm, no obvious murmurs or rubs are noted.  Neuromuscular: Range move the cervical spine lacks about 40 degrees bilaterally with lateral rotation.  Skin: Extremities are without significant edema.  Neurologic Exam  Mental status: The patient is alert and oriented x 3 at the time of the examination. The patient has apparent normal recent and remote memory, with an apparently normal attention span and concentration ability.  Cranial nerves: Facial symmetry is present. There is good sensation of the face to pinprick and soft touch bilaterally. The strength of the facial muscles and the muscles to head turning and  shoulder shrug are normal bilaterally. Speech is well enunciated, no aphasia or dysarthria is noted. Extraocular movements are full. Visual fields are full. The tongue is midline, and the patient has symmetric elevation of the soft palate. No obvious hearing deficits are noted.  Motor: The motor testing reveals normal strength with the right upper extremity.  With the left upper extremity, there is mild weakness with intrinsic muscles of the left hand, weakness with extension of the fingers bilaterally and with distal flexors of the fourth and fifth fingers of the left hand.  The patient has mild weakness with biceps and triceps and deltoid muscle and weakness with external rotation and internal rotation of the shoulder.  With the lower extremities, there are bilateral foot drops, approximately there is  good strength with hip flexion and knee flexion and extension.  Sensory: Sensory testing is notable for normal pinprick, soft touch, and vibration sensation on the right arm.  The patient has decreased pin fixation on the left shoulder and left arm and hand, vibration sensation is relatively symmetric in the hands, position sensation is present in both hands.  With the lower extremities, there is a severe deficit to pinprick, soft touch, and vibration sensation below the knees.  No evidence of extinction is noted.  Coordination: Cerebellar testing reveals good finger-nose-finger and heel-to-shin bilaterally.  Gait and station: Gait is slightly wide-based, unsteady.  Tandem gait is unsteady.  Romberg is negative but is unsteady.  Reflexes: Deep tendon reflexes are symmetric, but are depressed bilaterally. Toes are downgoing bilaterally.   Assessment/Plan:  1.  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  He has already started to improve with ability to raise up the left arm.  At this point, the use of prednisone or IVIG is not indicated.  The patient will be sent for EMG and nerve conduction study, nerve conductions will be done on both arms and one leg.  The patient will have EMG on the left arm.  Gabapentin will be added taking 300 mg twice daily for 1 week and then go to 300 mg in the morning and 600 mg in the evening.  He will call for any dose adjustments of the medication.  The patient will follow-up otherwise in 3 to 4 months.  The patient is not to receive flu vaccinations in the future.  Sean Alexanders MD 11/27/2018 3:13 PM  [facility] [address] [place], [place] [address]  Phone [phone] Fax [phone]

## 2018-12-03 ENCOUNTER — Encounter: Payer: Medicare HMO | Admitting: Neurology

## 2018-12-19 ENCOUNTER — Other Ambulatory Visit: Payer: Self-pay | Admitting: Neurology

## 2019-01-19 ENCOUNTER — Telehealth: Payer: Self-pay

## 2019-01-19 NOTE — Telephone Encounter (Signed)
I called and left a voicemail for patient asking that he call us back so that we can reschedule his nerve conduction study with Dr. Jannifer Franklin that was originally scheduled for March 25.

## 2019-01-28 ENCOUNTER — Other Ambulatory Visit: Payer: Self-pay

## 2019-01-28 ENCOUNTER — Ambulatory Visit (INDEPENDENT_AMBULATORY_CARE_PROVIDER_SITE_OTHER): Payer: Medicare HMO | Admitting: Neurology

## 2019-01-28 ENCOUNTER — Encounter: Payer: Self-pay | Admitting: Neurology

## 2019-01-28 DIAGNOSIS — G540 Brachial plexus disorders: Secondary | ICD-10-CM

## 2019-01-28 DIAGNOSIS — E538 Deficiency of other specified B group vitamins: Secondary | ICD-10-CM

## 2019-01-28 NOTE — Progress Notes (Signed)
Please refer to EMG and nerve conduction procedure note.  

## 2019-01-28 NOTE — Progress Notes (Addendum)
The patient comes in today for EMG nerve conduction study.  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  The patient has a 20-year history of a foot drop on the left and then 10 years ago on the right the foot drop developed.  He has had documented evidence of a peripheral neuropathy.  He was told he had axonal and demyelinating features.  He does not recall that he ever had a lumbar puncture as part of the evaluation for his neuropathy.  He comes in today for the study which shows some acute denervation in the intrinsic muscles of the left hand with chronic changes throughout the left arm proximally and distally.  The nerve conductions do show evidence of a mixed axonal and demyelinating neuropathy.  The patient will be sent for blood work.  He claims that the pain associated with left arm is gradually improving.  He still has ongoing weakness primarily the deltoid muscle and with supination of the left arm and with grip strength.     [place]    Nerve / Sites Muscle Latency Ref. Amplitude Ref. Rel Amp Segments Distance Velocity Ref. Area    ms ms mV mV %  cm m/s m/s mVms  R Median - APB     Wrist APB 4.1 ?4.4 9.2 ?4.0 100 Wrist - APB 7   28.7     Upper arm APB 10.7  0.3  3.69 Upper arm - Wrist 24 36 ?49 1.1  L Median - APB     Wrist APB 4.8 ?4.4 5.8 ?4.0 100 Wrist - APB 7   21.0     Upper arm APB NR  NR  NR Upper arm - Wrist 24 NR ?49 NR  R Ulnar - ADM     Wrist ADM 3.3 ?3.3 7.0 ?6.0 100 Wrist - ADM 7   17.9     B.Elbow ADM 7.9  4.9  70 B.Elbow - Wrist 22 47 ?49 13.3     A.Elbow ADM 10.2  4.6  94.4 A.Elbow - B.Elbow 10 44 ?49 14.3         A.Elbow - Wrist      L Ulnar - ADM     Wrist ADM 3.6 ?3.3 4.6 ?6.0 100 Wrist - ADM 7   13.7     B.Elbow ADM 8.1  2.4  51.6 B.Elbow - Wrist 22 49 ?49 7.0     A.Elbow ADM 10.9  2.3  97.1 A.Elbow - B.Elbow 10 36 ?49 9.6         A.Elbow - Wrist      L Peroneal - EDB     Ankle EDB 8.0 ?6.5 0.3 ?2.0 100  Ankle - EDB 9   1.3     Fib head EDB 21.5  0.3  101 Fib head - Ankle 36 27 ?44 0.9     Pop fossa EDB NR  NR  NR Pop fossa - Fib head 10 NR ?44 NR         Pop fossa - Ankle      L Tibial - AH     Ankle AH NR ?5.8 NR ?4.0 NR Ankle - AH 9   NR     Pop fossa AH NR  NR  NR Pop fossa - Ankle 43 NR ?41 NR                  SNC    Nerve / Sites Rec. Site  Peak Lat Ref.  Amp Ref. Segments Distance    ms ms V V  cm  R Radial - Anatomical snuff box (Forearm)     Forearm Wrist 2.4 ?2.9 9 ?15 Forearm - Wrist 10  L Radial - Anatomical snuff box (Forearm)     Forearm Wrist 2.3 ?2.9 12 ?15 Forearm - Wrist 10  L Sural - Ankle (Calf)     Calf Ankle NR ?4.4 NR ?6 Calf - Ankle 14  L Superficial peroneal - Ankle     Lat leg Ankle NR ?4.4 NR ?6 Lat leg - Ankle 14  R Median - Orthodromic (Dig II, Mid palm)     Dig II Wrist 3.4 ?3.4 3 ?10 Dig II - Wrist 13  L Median - Orthodromic (Dig II, Mid palm)     Dig II Wrist 3.3 ?3.4 5 ?10 Dig II - Wrist 13  R Ulnar - Orthodromic, (Dig V, Mid palm)     Dig V Wrist NR ?3.1 NR ?5 Dig V - Wrist 11  L Ulnar - Orthodromic, (Dig V, Mid palm)     Dig V Wrist NR ?3.1 NR ?5 Dig V - Wrist 97                     F  Wave    Nerve F Lat Ref.   ms ms  R Ulnar - ADM 34.0 ?32.0  L Ulnar - ADM 38.2 ?32.0

## 2019-01-28 NOTE — Procedures (Signed)
     HISTORY:  Sean Arnold is a 63 year old gentleman with a 20-year history of bilateral foot drops and a peripheral neuropathy who had a flu vaccination done in the fall 2019 and within 30 hours developed severe pain and weakness involving the left arm.  The patient is being evaluated for possible Parsonage-Turner syndrome.  NERVE CONDUCTION STUDIES:  Nerve conduction studies were performed on both upper extremities.  The distal motor latencies for the median nerves were prolonged bilaterally with normal motor amplitudes for these nerves bilaterally.  On the left side however, there appears to be no response with proximal stimulation of the median nerve.  The distal motor latencies for the ulnar nerves were normal on the right and prolonged on the left with a slightly low motor amplitudes on the left, normal on the right.  Slowing was seen for the right median nerve and for the ulnar nerves bilaterally.  The radial sensory latencies are normal bilaterally, the median sensory latencies are borderline normal on the right and normal on the left with no response seen for the ulnar sensory latencies on either side.  The ulnar F-wave latencies are prolonged bilaterally.  Nerve conduction studies were performed on the left lower extremity.  The distal motor latency for the left peroneal nerve was significantly prolonged with a very low motor amplitude.  There was no response for the left posterior tibial nerve.  Significant slowing was seen for the left peroneal nerve at the fibular head, no response was seen at the popliteal fossa.  The left sural and peroneal sensory latencies were unobtainable.  EMG STUDIES:  EMG study was performed on the left upper extremity:  The first dorsal interosseous muscle reveals 2 to 5 K units with decreased recruitment.  2+ fibrillations and positive waves were noted. The abductor pollicis brevis muscle reveals 2 to 5 K units with decreased recruitment. No fibrillations  or positive waves were noted. The extensor indicis proprius muscle reveals 2 to 4 K units with decreased recruitment. No fibrillations or positive waves were noted. The pronator teres muscle reveals 2 to 5 K units with decreased recruitment. No fibrillations or positive waves were noted. The biceps muscle reveals 2 to 4 K units with decreased recruitment. No fibrillations or positive waves were noted. The triceps muscle reveals 2 to 4 K units with decreased recruitment. No fibrillations or positive waves were noted. The anterior deltoid muscle reveals 2 to 5 K units with decreased recruitment. No fibrillations or positive waves were noted. The cervical paraspinal muscles were tested at 2 levels. No abnormalities of insertional activity were seen at either level tested. There was poor relaxation.    IMPRESSION:  Nerve conduction studies done on both upper extremities and on the left lower extremity shows evidence of a generalized peripheral neuropathy with mixed demyelinating and axonal features.  Involvement of the median and ulnar nerves were seen in the upper extremities bilaterally.  EMG evaluation of the left upper extremity shows some distal acute denervation but primarily there was chronic neuropathic denervation distally and proximally in all muscles tested.  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  Clinical correlation is required.  Jill Alexanders MD 01/28/2019 3:11 PM  [facility] [address] [place], [place] [address]  Phone [phone] Fax [phone]

## 2019-01-30 LAB — MULTIPLE MYELOMA PANEL, SERUM
Albumin SerPl Elph-Mcnc: 3.7 g/dL (ref 2.9–4.4)
Albumin/Glob SerPl: 1.1 (ref 0.7–1.7)
Alpha 1: 0.3 g/dL (ref 0.0–0.4)
Alpha2 Glob SerPl Elph-Mcnc: 0.8 g/dL (ref 0.4–1.0)
B-Globulin SerPl Elph-Mcnc: 1.4 g/dL — ABNORMAL HIGH (ref 0.7–1.3)
Gamma Glob SerPl Elph-Mcnc: 1.1 g/dL (ref 0.4–1.8)
Globulin, Total: 3.6 g/dL (ref 2.2–3.9)
IgA/Immunoglobulin A, Serum: 699 mg/dL — ABNORMAL HIGH (ref 61–437)
IgG (Immunoglobin G), Serum: 1274 mg/dL (ref 603–1613)
IgM (Immunoglobulin M), Srm: 50 mg/dL (ref 20–172)
M Protein SerPl Elph-Mcnc: 0.4 g/dL — ABNORMAL HIGH
Total Protein: 7.3 g/dL (ref 6.0–8.5)

## 2019-01-30 LAB — PAN-ANCA
ANCA Proteinase 3: <3.5 U/mL (ref 0.0–3.5)
Atypical pANCA: <1:20 {titer}
C-ANCA: <1:20 {titer}
Myeloperoxidase Ab: <9 U/mL (ref 0.0–9.0)
P-ANCA: <1:20 {titer}

## 2019-01-30 LAB — B. BURGDORFI ANTIBODIES: Lyme IgG/IgM Ab: <0.91 {ISR} (ref 0.00–0.90)

## 2019-01-30 LAB — ANGIOTENSIN CONVERTING ENZYME: Angio Convert Enzyme: 5 U/L — ABNORMAL LOW (ref 14–82)

## 2019-01-30 LAB — SEDIMENTATION RATE: Sed Rate: 41 mm/hr — ABNORMAL HIGH (ref 0–30)

## 2019-01-30 LAB — ANA W/REFLEX: Anti Nuclear Antibody (ANA): NEGATIVE

## 2019-01-30 LAB — VITAMIN B12: Vitamin B-12: 676 pg/mL (ref 232–1245)

## 2019-01-30 LAB — RHEUMATOID FACTOR: Rheumatoid fact SerPl-aCnc: <10 IU/mL (ref 0.0–13.9)

## 2019-02-03 ENCOUNTER — Telehealth: Payer: Self-pay | Admitting: Neurology

## 2019-02-03 DIAGNOSIS — D472 Monoclonal gammopathy: Secondary | ICD-10-CM

## 2019-02-03 NOTE — Telephone Encounter (Signed)
I called the patient.  The patient has evidence of a monoclonal antibody with IgG, lambda chain.  The patient has had a neuropathy for greater than 10 years, he did finally find the prior blood work that was done, he will send me the reports.  We discussed getting a hematology/oncology referral, he is amenable to this.  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

## 2019-02-04 ENCOUNTER — Telehealth: Payer: Self-pay | Admitting: Hematology

## 2019-02-04 NOTE — Telephone Encounter (Signed)
A new hem appt has been scheduled for the pt to see Dr. Irene Limbo on 6/29 at 1pm. Pt has been cld and made aware of the appt date and time. Aware to arrive 15 minutes early.

## 2019-03-06 ENCOUNTER — Telehealth: Payer: Self-pay

## 2019-03-06 NOTE — Telephone Encounter (Signed)
Called and left voicemail regarding pre-screening questions for appt on 6/29  

## 2019-03-09 ENCOUNTER — Inpatient Hospital Stay: Payer: Medicare HMO

## 2019-03-09 ENCOUNTER — Other Ambulatory Visit: Payer: Self-pay

## 2019-03-09 ENCOUNTER — Other Ambulatory Visit: Payer: Self-pay | Admitting: Lab

## 2019-03-09 ENCOUNTER — Inpatient Hospital Stay: Payer: Medicare HMO | Attending: Hematology | Admitting: Hematology

## 2019-03-09 VITALS — BP 149/82 | HR 70 | Temp 98.2°F | Resp 17 | Ht 74.0 in | Wt 313.5 lb

## 2019-03-09 DIAGNOSIS — R7989 Other specified abnormal findings of blood chemistry: Secondary | ICD-10-CM | POA: Insufficient documentation

## 2019-03-09 DIAGNOSIS — D472 Monoclonal gammopathy: Secondary | ICD-10-CM | POA: Diagnosis present

## 2019-03-09 DIAGNOSIS — I480 Paroxysmal atrial fibrillation: Secondary | ICD-10-CM | POA: Diagnosis not present

## 2019-03-09 LAB — CMP (CANCER CENTER ONLY)
ALT: 22 U/L (ref 0–44)
AST: 27 U/L (ref 15–41)
Albumin: 3.8 g/dL (ref 3.5–5.0)
Alkaline Phosphatase: 54 U/L (ref 38–126)
Anion gap: 8 (ref 5–15)
BUN: 15 mg/dL (ref 8–23)
CO2: 26 mmol/L (ref 22–32)
Calcium: 8.7 mg/dL — ABNORMAL LOW (ref 8.9–10.3)
Chloride: 102 mmol/L (ref 98–111)
Creatinine: 0.91 mg/dL (ref 0.61–1.24)
GFR, Est AFR Am: >60 mL/min (ref >60)
GFR, Estimated: >60 mL/min (ref >60)
Glucose, Bld: 97 mg/dL (ref 70–99)
Potassium: 3.9 mmol/L (ref 3.5–5.1)
Sodium: 136 mmol/L (ref 135–145)
Total Bilirubin: 0.5 mg/dL (ref 0.3–1.2)
Total Protein: 7.7 g/dL (ref 6.5–8.1)

## 2019-03-09 LAB — CBC WITH DIFFERENTIAL/PLATELET
Abs Immature Granulocytes: 0.04 10*3/uL (ref 0.00–0.07)
Basophils Absolute: 0 10*3/uL (ref 0.0–0.1)
Basophils Relative: 1 %
Eosinophils Absolute: 0.3 10*3/uL (ref 0.0–0.5)
Eosinophils Relative: 4 %
HCT: 42.2 % (ref 39.0–52.0)
Hemoglobin: 14.6 g/dL (ref 13.0–17.0)
Immature Granulocytes: 1 %
Lymphocytes Relative: 16 %
Lymphs Abs: 1.3 10*3/uL (ref 0.7–4.0)
MCH: 31.3 pg (ref 26.0–34.0)
MCHC: 34.6 g/dL (ref 30.0–36.0)
MCV: 90.4 fL (ref 80.0–100.0)
Monocytes Absolute: 0.4 10*3/uL (ref 0.1–1.0)
Monocytes Relative: 5 %
Neutro Abs: 6.2 10*3/uL (ref 1.7–7.7)
Neutrophils Relative %: 73 %
Platelets: 220 10*3/uL (ref 150–400)
RBC: 4.67 MIL/uL (ref 4.22–5.81)
RDW: 12.2 % (ref 11.5–15.5)
WBC: 8.3 10*3/uL (ref 4.0–10.5)
nRBC: 0 % (ref 0.0–0.2)

## 2019-03-09 LAB — SEDIMENTATION RATE: Sed Rate: 14 mm/hr (ref 0–16)

## 2019-03-09 NOTE — Progress Notes (Signed)
HEMATOLOGY/ONCOLOGY CONSULTATION NOTE  Date of Service: 03/09/2019  Patient Care Team: Daryll Brod, MD as PCP - General (Cardiology)  CHIEF COMPLAINTS/PURPOSE OF CONSULTATION:  Monoclonal Paraproteinemia  HISTORY OF PRESENTING ILLNESS:   Sean Arnold is a wonderful 63 y.o. male who has been referred to [place] by Dr. 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 The pt reports that he is doing well overall.   The pt reports that he began having left sided foot drop in the mid 1990s which was progressive, which advanced to below the left knee. In early 2000 he noticed the same symptom in his right leg. He notes many injuries to the lumbar spine which he associates with a history of athletics. He previously rode 8000 miles a year, but not recently. He had a 4 level laminectomy in the past and is considering a complete fusion. He is scheduled for an MRI total spine in the coming weeks.  The pt also endorses a history of paroxysmal Afib. He has had 26 cardioversions. Has had a left knee replacement which went well. He had cervical fusion with C5-C6 followed by demolition. He is anticipating further cervical spine surgery and endorses stenosis with impingement.  The pt notes that he does stumble and fall. He is able to mow his lawn, but needs to pause between each row to take a seat. The pt notes that he does not sleep well due to pain with various positions. He endorses frequent napping. He was placed on opioid therapy two years ago. He has also tried Neurontin without relief. He has used [place] which hasn't been helpful for him. He is on a 23m Prednisone daily.  The pt had a "severe reaction" to the flu shot in both 2019 and 2018. 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 He had a weakness, and pain in his right shoulder at the site of the injection. He has been advised to forego subsequent annual flu  vaccines. He endorses persisting weakness in his left arm. His upper extremity symptoms are new since 2018.  The pt notes that he has had a Ferritin level in the 400s, about 20 years ago. He pursued therapeutic phlebotomies and began feeling badly as he became anemic. 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   On PMHx the pt reports 20 year history of b/l foot drops, peripheral neuropathy, brachial plexopathy, gastro vertical sleeve. On Social Hx the pt reports working as a rEducation officer, museum previously worked for [employer] On Family Hx the pt denies cancer.   MEDICAL HISTORY:  Past Medical History:  Diagnosis Date   A-fib ([facility]    Anxiety    Arthritis    Brachial plexopathy 33/09/6008  Left    Complication of anesthesia    Depression    Dysrhythmia    a-Fib    ablation   Hypertension  Neuromuscular disorder (HCC)    neuropathy  both legs and feet   PONV (postoperative nausea and vomiting)    Severe  nausea   Sleep apnea    CPAP     SURGICAL HISTORY: Past Surgical History:  Procedure Laterality Date   ablation for atrial fib     ACHILLES TENDON REPAIR     arthroscopic knee Bilateral    BACK SURGERY     times four   CERVICAL FUSION     LAMINECTOMY     LAPAROSCOPIC GASTRIC SLEEVE RESECTION     TOTAL KNEE ARTHROPLASTY Left 01/27/2018   Procedure: LEFT TOTAL KNEE ARTHROPLASTY;  Surgeon: Vickey Huger, MD;  Location: [facility];  Service:  Orthopedics;  Laterality: Left;    SOCIAL HISTORY: Social History   Socioeconomic History   Marital status: Legally Separated    Spouse name: Not on file   Number of children: 2   Years of education: Not on file   Highest education level: Not on file  Occupational History   Not on file  Social Needs   Financial resource strain: Not on file   Food insecurity    Worry: Not on file    Inability: Not on file   Transportation needs    Medical: Not on file    Non-medical: Not on file  Tobacco Use   Smoking status: Current Some Day Smoker    Years: 40.00    Types: Cigarettes   Smokeless tobacco: Never Used  Substance and Sexual Activity   Alcohol use: Yes    Comment: rarely   Drug use: Never   Sexual activity: Not on file  Lifestyle   Physical activity    Days per week: Not on file    Minutes per session: Not on file   Stress: Not on file  Relationships   Social connections    Talks on phone: Not on file    Gets together: Not on file    Attends religious service: Not on file    Active member of club or organization: Not on file    Attends meetings of clubs or organizations: Not on file    Relationship status: Not on file   Intimate partner violence    Fear of current or ex partner: Not on file    Emotionally abused: Not on file    Physically abused: Not on file    Forced sexual activity: Not on file  Other Topics Concern   Not on file  Social History Narrative   Right handed    Caffeine 5-6 cups daily   Lives at home with dog max     FAMILY HISTORY: Family History  Problem Relation Age of Onset   GI problems Mother    Pneumonia Father    Atrial fibrillation Father     ALLERGIES:  is allergic to morphine and related.  MEDICATIONS:  Current Outpatient Medications  Medication Sig Dispense Refill   acetaminophen (TYLENOL) 500 MG tablet Take 250 mg by mouth every 6 (six) hours as needed (for pain.).     aspirin EC 325 MG EC tablet  Take 1 tablet (325 mg total) by mouth 2 (two) times daily. 30 tablet 0   lisinopril-hydrochlorothiazide (PRINZIDE,ZESTORETIC) 20-12.5 MG tablet Take 1 tablet by mouth 2 (two) times daily.     Methylnaltrexone Bromide (RELISTOR) 150 MG TABS Take 150 mg by mouth daily.     oxyCODONE-acetaminophen (PERCOCET) 10-325 MG tablet oxycodone-acetaminophen 10 mg-325 mg tablet  one po qid prn  pain. fill date: 06/03/17     predniSONE (DELTASONE) 5 MG tablet Take 5 mg by mouth daily with breakfast.     No current facility-administered medications for this visit.     REVIEW OF SYSTEMS:    10 Point review of Systems was done is negative except as noted above.  PHYSICAL EXAMINATION:  Vitals:   03/09/19 1329  BP: (!) 149/82  Pulse: 70  Resp: 17  Temp: 98.2 F (36.8 C)  SpO2: 99%   Filed Weights   03/09/19 1329  Weight: (!) 313 lb 8 oz (142.2 kg)   Body mass index is 40.25 kg/m.  GENERAL:alert, in no acute distress and comfortable SKIN: no acute rashes, no significant lesions EYES: conjunctiva are pink and non-injected, sclera anicteric OROPHARYNX: MMM, no exudates, no oropharyngeal erythema or ulceration NECK: supple, no JVD LYMPH:  no palpable lymphadenopathy in the cervical, axillary or inguinal regions LUNGS: clear to auscultation b/l with normal respiratory effort HEART: regular rate & rhythm ABDOMEN:  normoactive bowel sounds , non tender, not distended. Extremity: no pedal edema PSYCH: alert & oriented x 3 with fluent speech NEURO: no focal motor/sensory deficits  LABORATORY DATA:  I have reviewed the data as listed  . CBC Latest Ref Rng & Units 03/09/2019 07/31/2018 01/29/2018  WBC 4.0 - 10.5 K/uL 8.3 12.4(H) 19.0(H)  Hemoglobin 13.0 - 17.0 g/dL 14.6 15.4 14.1  Hematocrit 39.0 - 52.0 % 42.2 45.9 40.5  Platelets 150 - 400 K/uL 220 258 249    . CMP Latest Ref Rng & Units 03/09/2019 01/28/2019 07/31/2018  Glucose 70 - 99 mg/dL 97 - 118(H)  BUN 8 - 23 mg/dL 15 - 13    Creatinine 0.61 - 1.24 mg/dL 0.91 - 0.75  Sodium 135 - 145 mmol/L 136 - 138  Potassium 3.5 - 5.1 mmol/L 3.9 - 3.6  Chloride 98 - 111 mmol/L 102 - 101  CO2 22 - 32 mmol/L 26 - 24  Calcium 8.9 - 10.3 mg/dL 8.7(L) - 8.9  Total Protein 6.5 - 8.1 g/dL 7.7 7.3 7.9  Total Bilirubin 0.3 - 1.2 mg/dL 0.5 - 0.7  Alkaline Phos 38 - 126 U/L 54 - 46  AST 15 - 41 U/L 27 - 32  ALT 0 - 44 U/L 22 - 24     RADIOGRAPHIC STUDIES: I have personally reviewed the radiological images as listed and agreed with the findings in the report. No results found.  ASSESSMENT & PLAN:  62 y.o. male with  1. 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 PLAN -Discussed patient's most recent labs from 01/28/19, the MMP revealed IgA slightly elevated at 699, and an M spike of 0.4g with IgG Lambda specificity -Discussed that the pt's neurologic concerns do appear to have orthopedic etiologies causing radiculopathy though some demyelination was noted in the 01/28/19 NCV with EMG. There was also concern that his symptoms neuropathy got worse after previous vaccination and could be from post vaccinal injury as well. -Discussed that MGUS rarely causes neuropathies, and is less associated with IgG antibodies in general compared to IgM antibodies. -Discussed that I have a low level of suspicion for Multiple Myeloma -Discussed that MGUS causing neuropathy or AL amyloidosis causing neuropathy though less likely only be proven by a nerve biopsy. I do not feel strongly about this but will defer this decision to his neurologist. -Discussed the indication for impending work up as to rule out a plasma cell disorder -Pt reports that he is scheduled to have MRI total spine  with orthopedics soon -Recommend empiric Vitamin B complex -Will order labs today -Will see the pt back in 6 months  2. H/o elevated ferritin levels and reports possible hemochromatosis though not sure of previous genetic testing -will check ferritin and iron  profile  -HFE gene mutation testing   Labs today. Telephone visit in 2 weeks   All of the patients questions were answered with apparent satisfaction. The patient knows to call the clinic with any problems, questions or concerns.  The total time spent in the appt was 35 minutes and more than 50% was on counseling and direct patient cares.    Sullivan Lone MD MS AAHIVMS [facility] Hematology/Oncology Physician [facility]  (Office):       [phone] (Work cell):  [phone] (Fax):           [phone]  03/09/2019 2:40 PM  I, Baldwin Jamaica, am acting as a scribe for Dr. Sullivan Lone.   .I have reviewed the above documentation for accuracy and completeness, and I agree with the above. Brunetta Genera MD

## 2019-03-10 ENCOUNTER — Telehealth: Payer: Self-pay | Admitting: Hematology

## 2019-03-10 LAB — MULTIPLE MYELOMA PANEL, SERUM
Albumin SerPl Elph-Mcnc: 3.8 g/dL (ref 2.9–4.4)
Albumin/Glob SerPl: 1.2 (ref 0.7–1.7)
Alpha 1: 0.2 g/dL (ref 0.0–0.4)
Alpha2 Glob SerPl Elph-Mcnc: 0.9 g/dL (ref 0.4–1.0)
B-Globulin SerPl Elph-Mcnc: 1.1 g/dL (ref 0.7–1.3)
Gamma Glob SerPl Elph-Mcnc: 1.1 g/dL (ref 0.4–1.8)
Globulin, Total: 3.3 g/dL (ref 2.2–3.9)
IgA: 625 mg/dL — ABNORMAL HIGH (ref 61–437)
IgG (Immunoglobin G), Serum: 1184 mg/dL (ref 603–1613)
IgM (Immunoglobulin M), Srm: 47 mg/dL (ref 20–172)
M Protein SerPl Elph-Mcnc: 0.4 g/dL — ABNORMAL HIGH
Total Protein ELP: 7.1 g/dL (ref 6.0–8.5)

## 2019-03-10 LAB — IRON AND TIBC
Iron: 89 ug/dL (ref 42–163)
Saturation Ratios: 33 % (ref 20–55)
TIBC: 271 ug/dL (ref 202–409)
UIBC: 183 ug/dL (ref 117–376)

## 2019-03-10 LAB — KAPPA/LAMBDA LIGHT CHAINS
Kappa free light chain: 23.8 mg/L — ABNORMAL HIGH (ref 3.3–19.4)
Kappa, lambda light chain ratio: 1.25 (ref 0.26–1.65)
Lambda free light chains: 19 mg/L (ref 5.7–26.3)

## 2019-03-10 LAB — BETA 2 MICROGLOBULIN, SERUM: Beta-2 Microglobulin: 1.8 mg/L (ref 0.6–2.4)

## 2019-03-10 LAB — FERRITIN: Ferritin: 72 ng/mL (ref 24–336)

## 2019-03-10 NOTE — Telephone Encounter (Signed)
No los per 6/29. °

## 2019-03-16 ENCOUNTER — Telehealth: Payer: Self-pay | Admitting: Hematology

## 2019-03-16 LAB — HEMOCHROMATOSIS DNA-PCR(C282Y,H63D)

## 2019-03-16 NOTE — Telephone Encounter (Signed)
Scheduled appt per 7/06 sch message . Pt aware of appt date and time

## 2019-03-24 ENCOUNTER — Other Ambulatory Visit: Payer: Self-pay | Admitting: Neurosurgery

## 2019-03-24 DIAGNOSIS — M545 Low back pain, unspecified: Secondary | ICD-10-CM

## 2019-03-24 DIAGNOSIS — M542 Cervicalgia: Secondary | ICD-10-CM

## 2019-03-24 DIAGNOSIS — G8929 Other chronic pain: Secondary | ICD-10-CM

## 2019-04-02 ENCOUNTER — Ambulatory Visit: Payer: Medicare HMO | Admitting: Neurology

## 2019-04-02 ENCOUNTER — Encounter: Payer: Self-pay | Admitting: Neurology

## 2019-04-02 ENCOUNTER — Other Ambulatory Visit: Payer: Self-pay

## 2019-04-02 VITALS — BP 110/80 | HR 78 | Temp 98.2°F | Ht 74.0 in | Wt 307.5 lb

## 2019-04-02 DIAGNOSIS — D472 Monoclonal gammopathy: Secondary | ICD-10-CM

## 2019-04-02 DIAGNOSIS — G629 Polyneuropathy, unspecified: Secondary | ICD-10-CM

## 2019-04-02 DIAGNOSIS — G540 Brachial plexus disorders: Secondary | ICD-10-CM | POA: Diagnosis not present

## 2019-04-02 DIAGNOSIS — G609 Hereditary and idiopathic neuropathy, unspecified: Secondary | ICD-10-CM | POA: Insufficient documentation

## 2019-04-02 HISTORY — DX: Polyneuropathy, unspecified: G62.9

## 2019-04-02 HISTORY — DX: Monoclonal gammopathy: D47.2

## 2019-04-02 NOTE — Progress Notes (Signed)
Reason for visit: Peripheral neuropathy, left Parsonage-Turner syndrome, MGUS  Sean Arnold is an 63 y.o. male  History of present illness:  Sean Arnold is a 63 year old right-handed white male with a history of a peripheral neuropathy that likely dates back to the 1990s.  The patient has had onset of a left brachial plexopathy following a flu vaccination in October 2019.  The patient has had improvement in the pain involving the left arm and shoulder but he continues to have some weakness with elevation of the left arm.  He had similar problems on the right side from the flu shot in 2018.  The patient is not sleeping well for other reasons.  The patient believes that he has good grip strength with hands, but the left upper arm is still giving him problems.  He has been seen through oncology for his monoclonal antibody, it is felt that this is likely a benign problem.  Past Medical History:  Diagnosis Date  . A-fib ([facility])   . Anxiety   . Arthritis   . 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 Complication of anesthesia   . Depression   . Dysrhythmia    a-Fib    ablation  . Hypertension   . Neuromuscular disorder (HCC)    neuropathy  both legs and feet  . PONV (postoperative nausea and vomiting)    Severe  nausea  . Sleep apnea    CPAP     Past Surgical History:  Procedure Laterality Date  . ablation for atrial fib    . ACHILLES TENDON REPAIR    . arthroscopic knee Bilateral   . BACK SURGERY     times four  . CERVICAL FUSION    . LAMINECTOMY    . LAPAROSCOPIC GASTRIC SLEEVE RESECTION    . TOTAL KNEE ARTHROPLASTY Left 01/27/2018   Procedure: LEFT TOTAL KNEE ARTHROPLASTY;  Surgeon: Vickey Huger, MD;  Location: [facility];  Service: Orthopedics;  Laterality: Left;    Family History  Problem Relation Age of Onset  . GI problems Mother   . Pneumonia Father   . Atrial fibrillation Father     Social history:  reports that he has been smoking cigarettes. He has smoked for the past  40.00 years. He has never used smokeless tobacco. He reports current alcohol use. He reports that he does not use drugs.    Allergies  Allergen Reactions  . Morphine And Related Anaphylaxis and Shortness Of Breath    Breathing Distress (patient states it was only because of large dose)    Medications:  Prior to Admission medications   Medication Sig Start Date End Date Taking? Authorizing Provider  acetaminophen (TYLENOL) 500 MG tablet Take 250 mg by mouth every 6 (six) hours as needed (for pain.).   Yes [provider]  aspirin EC 325 MG EC tablet Take 1 tablet (325 mg total) by mouth 2 (two) times daily. 01/28/18  Yes [name], PA  lisinopril-hydrochlorothiazide (PRINZIDE,ZESTORETIC) 20-12.5 MG tablet Take 1 tablet by mouth 2 (two) times daily.   Yes [provider]  Methylnaltrexone Bromide (RELISTOR) 150 MG TABS Take 150 mg by mouth as needed.    Yes [provider]  Naloxegol Oxalate (MOVANTIK PO) Take by mouth as needed.   Yes [provider]  Oxycodone HCl 10 MG TABS Take 10 mg by mouth 4 (four) times daily.  03/20/19  Yes [provider]    ROS:  Out of a complete  14 system review of symptoms, the patient complains only of the following symptoms, and all other reviewed systems are negative.  Arm weakness Numbness Gait instability  Blood pressure 110/80, pulse 78, temperature 98.2 F (36.8 C), height 6\' 2"  (1.88 m), weight (!) 307 lb 8 oz (139.5 kg), SpO2 98 %.  Physical Exam  General: The patient is alert and cooperative at the time of the examination.  The patient is markedly obese.  Skin: No significant peripheral edema is noted.   Neurologic Exam  Mental status: The patient is alert and oriented x 3 at the time of the examination. The patient has apparent normal recent and remote memory, with an apparently normal attention span and concentration ability.   Cranial nerves: Facial symmetry is present. Speech is  normal, no aphasia or dysarthria is noted. Extraocular movements are full. Visual fields are full.  Motor: The patient has good strength in all 4 extremities, with exception of some slight weakness with the deltoid muscle on the left and with external rotation and internal rotation of the left arm.  He may have a very mild left foot drop.  Sensory examination: Soft touch sensation is symmetric on the face, arms, and legs.  Coordination: The patient has good finger-nose-finger and heel-to-shin bilaterally.  Gait and station: The patient has a wide-based gait, the gait is unsteady, the patient is stooped with walking.  Tandem gait is unsteady.  Romberg is negative but is unsteady.  Reflexes: Deep tendon reflexes are symmetric, but are depressed to absent throughout.   Assessment/Plan:  1.  Peripheral neuropathy  2.  Monoclonal antibody, IgG  3.  Gait disturbance  4.  Left Parsonage-Turner syndrome  The patient has been told not to get a flu shot in the future.  The patient does have a peripheral neuropathy but likely developed a Parsonage-Turner syndrome following a flu shot.  He has had good resolution of the pain involving the left arm, we will continue to follow him over time, hopefully the proximal weakness of the left arm will gradually get better.  He will follow-up in 6 months.  Greater than 50% of the visit was spent in counseling and coordination of care.  Face-to-face time with the patient was 25 minutes.   Jill Alexanders MD 04/02/2019 3:25 PM  [facility] [address] [place],  [address]  Phone [phone] Fax [phone]

## 2019-04-06 NOTE — Progress Notes (Signed)
HEMATOLOGY/ONCOLOGY CONSULTATION NOTE  Date of Service: 04/07/2019  Patient Care Team: Daryll Brod, MD as PCP - General (Cardiology)  CHIEF COMPLAINTS/PURPOSE OF CONSULTATION:  Monoclonal Paraproteinemia  HISTORY OF PRESENTING ILLNESS:   Sean Arnold is a wonderful 63 y.o. male who has been referred to [place] by Dr. 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 The pt reports that he is doing well overall.   The pt reports that he began having left sided foot drop in the mid 1990s which was progressive, which advanced to below the left knee. In early 2000 he noticed the same symptom in his right leg. He notes many injuries to the lumbar spine which he associates with a history of athletics. He previously rode 8000 miles a year, but not recently. He had a 4 level laminectomy in the past and is considering a complete fusion. He is scheduled for an MRI total spine in the coming weeks.  The pt also endorses a history of paroxysmal Afib. He has had 26 cardioversions. Has had a left knee replacement which went well. He had cervical fusion with C5-C6 followed by demolition. He is anticipating further cervical spine surgery and endorses stenosis with impingement.  The pt notes that he does stumble and fall. He is able to mow his lawn, but needs to pause between each row to take a seat. The pt notes that he does not sleep well due to pain with various positions. He endorses frequent napping. He was placed on opioid therapy two years ago. He has also tried Neurontin without relief. He has used [place] which hasn't been helpful for him. He is on a 5mg  Prednisone daily.  The pt had a "severe reaction" to the flu shot in both 2019 and 2018. 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 He had a weakness, and pain in his right shoulder at the site of the injection. He has been advised to forego subsequent annual flu  vaccines. He endorses persisting weakness in his left arm. His upper extremity symptoms are new since 2018.  The pt notes that he has had a Ferritin level in the 400s, about 20 years ago. He pursued therapeutic phlebotomies and began feeling badly as he became anemic. 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  Most recent lab results 01/28/19 MMP revealed all values WNL except for IgA at 699, B-globulin at 1.4, and M Protein at 0.4g with IgG Lambda specificity 01/28/19 Sed Rate at 41 07/31/18 CBC revealed all values WNL except for WBC at 12.4k  On review of systems, pt reports good energy levels, weight gain, and denies concerns for infections, new back pains, and any other symptoms.   On PMHx the pt reports 20 year history of b/l foot drops, peripheral neuropathy, brachial plexopathy, gastro vertical sleeve. On Social Hx the pt reports working as a Education officer, museum, previously worked for [employer]. On Family Hx the pt denies cancer.  Interval History  I connected with Darra Lis on 04/07/2019 at  1:40 PM EDT by telephone and verified that I am speaking with the correct person using two identifiers.  I discussed the limitations, risks, security and privacy concerns of performing an evaluation and management service by telemedicine and the availability of in-person appointments. I  also discussed with the patient that there may be a patient responsible charge related to this service. The patient expressed understanding and agreed to proceed.   Other persons participating in the visit and their role in the encounter: none  Patient's location: home Provider's location: my office at the [place] is a 63 y.o. 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 The patient's last visit with  [place] was on 03/09/2019. The pt reports that he is doing well overall.  The pt reports that he is scheduled for a colonoscopy because he had a positive Cologuard test. His most recent colonoscopy was 7 years ago and he had several benign polyps removed. He has chronic constipation and a hx of proctitis.  He reports he had a reactions to the influenza vaccine in 09/2017 and 06/2018 which caused weakness in his entire left arm and neuropathy. 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  Most recent lab results from 03/09/2019 of CBC w/diff and CMP is as follows: all values are WNL except for calcium at 8.7. 03/09/2019 MMP revealed IgA at 625, M Protein at 0.4, and "Immunofixation shows IgG monoclonal protein with lambda light chain specificity. Polyclonal increase detected in one or more immunoglobulins." 03/09/2019 Hemochromatosis DNA, PCR revealed "Two copies of the same mutation (C282Y and C282Y) identified. Results for H63D and S65C were negative." 03/09/2019 K/L light chains at 23.8, K:L at 1.25 03/09/2019 Iron and TIBC, sed rate, Ferritin, beta 2 microglobulin all WNL  On review of systems, pt reports chronic constipation, neuropathy and denies any other symptoms.    MEDICAL HISTORY:  Past Medical History:  Diagnosis Date   A-fib [facility])    Anxiety    Arthritis    Brachial plexopathy 7/86/7672   Left    Complication of anesthesia    Depression    Dysrhythmia    a-Fib    ablation   Hypertension    MGUS (monoclonal gammopathy of unknown significance) 04/02/2019   IgG    Neuromuscular disorder (HCC)    neuropathy  both legs and feet   Peripheral neuropathy 04/02/2019   PONV (postoperative nausea and vomiting)    Severe  nausea   Sleep apnea    CPAP     SURGICAL HISTORY: Past Surgical History:  Procedure Laterality Date   ablation for atrial fib     ACHILLES TENDON REPAIR     arthroscopic knee Bilateral    BACK SURGERY     times  four   CERVICAL FUSION     LAMINECTOMY     LAPAROSCOPIC GASTRIC SLEEVE RESECTION     TOTAL KNEE ARTHROPLASTY Left 01/27/2018   Procedure: LEFT TOTAL KNEE ARTHROPLASTY;  Surgeon: Vickey Huger, MD;  Location: [place];  Service: Orthopedics;  Laterality: Left;    SOCIAL HISTORY: Social History   Socioeconomic History   Marital status: Legally Separated    Spouse name: Not on file   Number of children: 2   Years of education: Not on file   Highest education level: Not on file  Occupational History   Not on file  Social Needs   Financial resource strain: Not on file   Food insecurity    Worry: Not on file    Inability: Not on file   Transportation needs    Medical: Not on file    Non-medical: Not on file  Tobacco Use   Smoking status: Current Some Day Smoker  Years: 40.00    Types: Cigarettes   Smokeless tobacco: Never Used  Substance and Sexual Activity   Alcohol use: Yes    Comment: rarely   Drug use: Never   Sexual activity: Not on file  Lifestyle   Physical activity    Days per week: Not on file    Minutes per session: Not on file   Stress: Not on file  Relationships   Social connections    Talks on phone: Not on file    Gets together: Not on file    Attends religious service: Not on file    Active member of club or organization: Not on file    Attends meetings of clubs or organizations: Not on file    Relationship status: Not on file   Intimate partner violence    Fear of current or ex partner: Not on file    Emotionally abused: Not on file    Physically abused: Not on file    Forced sexual activity: Not on file  Other Topics Concern   Not on file  Social History Narrative   Right handed    Caffeine 5-6 cups daily   Lives at home with dog max     FAMILY HISTORY: Family History  Problem Relation Age of Onset   GI problems Mother    Pneumonia Father    Atrial fibrillation Father     ALLERGIES:  is allergic to morphine and  related.  MEDICATIONS:  Current Outpatient Medications  Medication Sig Dispense Refill   acetaminophen (TYLENOL) 500 MG tablet Take 250 mg by mouth every 6 (six) hours as needed (for pain.).     aspirin EC 325 MG EC tablet Take 1 tablet (325 mg total) by mouth 2 (two) times daily. 30 tablet 0   lisinopril-hydrochlorothiazide (PRINZIDE,ZESTORETIC) 20-12.5 MG tablet Take 1 tablet by mouth 2 (two) times daily.     Methylnaltrexone Bromide (RELISTOR) 150 MG TABS Take 150 mg by mouth as needed.      Naloxegol Oxalate (MOVANTIK PO) Take by mouth as needed.     Oxycodone HCl 10 MG TABS Take 10 mg by mouth 4 (four) times daily.      No current facility-administered medications for this visit.     REVIEW OF SYSTEMS:    A 10+ POINT REVIEW OF SYSTEMS WAS OBTAINED including neurology, dermatology, psychiatry, cardiac, respiratory, lymph, extremities, GI, GU, Musculoskeletal, constitutional, breasts, reproductive, HEENT.  All pertinent positives are noted in the HPI.  All others are negative.   PHYSICAL EXAMINATION:  There were no vitals filed for this visit. There were no vitals filed for this visit. There is no height or weight on file to calculate BMI.  Phone Visit  LABORATORY DATA:  I have reviewed the data as listed  03/09/2019 LABS Component     Latest Ref Rng & Units 03/09/2019  WBC     4.0 - 10.5 K/uL 8.3  RBC     4.22 - 5.81 MIL/uL 4.67  Hemoglobin     13.0 - 17.0 g/dL 14.6  HCT     39.0 - 52.0 % 42.2  MCV     80.0 - 100.0 fL 90.4  MCH     26.0 - 34.0 pg 31.3  MCHC     30.0 - 36.0 g/dL 34.6  RDW     11.5 - 15.5 % 12.2  Platelets     150 - 400 K/uL 220  nRBC     0.0 - 0.2 %  0.0  Neutrophils     % 73  NEUT#     1.7 - 7.7 K/uL 6.2  Lymphocytes     % 16  Lymphocyte #     0.7 - 4.0 K/uL 1.3  Monocytes Relative     % 5  Monocyte #     0.1 - 1.0 K/uL 0.4  Eosinophil     % 4  Eosinophils Absolute     0.0 - 0.5 K/uL 0.3  Basophil     % 1  Basophils  Absolute     0.0 - 0.1 K/uL 0.0  Immature Granulocytes     % 1  Abs Immature Granulocytes     0.00 - 0.07 K/uL 0.04  Sodium     135 - 145 mmol/L 136  Potassium     3.5 - 5.1 mmol/L 3.9  Chloride     98 - 111 mmol/L 102  CO2     22 - 32 mmol/L 26  Glucose     70 - 99 mg/dL 97  BUN     8 - 23 mg/dL 15  Creatinine     0.61 - 1.24 mg/dL 0.91  Calcium     8.9 - 10.3 mg/dL 8.7 (L)  Total Protein     6.5 - 8.1 g/dL 7.7  Albumin     3.5 - 5.0 g/dL 3.8  AST     15 - 41 U/L 27  ALT     0 - 44 U/L 22  Alkaline Phosphatase     38 - 126 U/L 54  Total Bilirubin     0.3 - 1.2 mg/dL 0.5  GFR, Est Non African American     >60 mL/min >60  GFR, Est African American     >60 mL/min >60  Anion gap     5 - 15 8  IgG (Immunoglobin G), Serum     603 - 1,613 mg/dL 1,184  IgA     61 - 437 mg/dL 625 (H)  IgM (Immunoglobulin M), Srm     20 - 172 mg/dL 47  Total Protein ELP     6.0 - 8.5 g/dL 7.1  Albumin SerPl Elph-Mcnc     2.9 - 4.4 g/dL 3.8  Alpha 1     0.0 - 0.4 g/dL 0.2  Alpha2 Glob SerPl Elph-Mcnc     0.4 - 1.0 g/dL 0.9  B-Globulin SerPl Elph-Mcnc     0.7 - 1.3 g/dL 1.1  Gamma Glob SerPl Elph-Mcnc     0.4 - 1.8 g/dL 1.1  M Protein SerPl Elph-Mcnc     Not Observed g/dL 0.4 (H)  Globulin, Total     2.2 - 3.9 g/dL 3.3  Albumin/Glob SerPl     0.7 - 1.7 1.2  IFE 1      Comment  Please Note (HCV):      Comment  Iron     42 - 163 ug/dL 89  TIBC     202 - 409 ug/dL 271  Saturation Ratios     20 - 55 % 33  UIBC     117 - 376 ug/dL 183  Kappa free light chain     3.3 - 19.4 mg/L 23.8 (H)  Lamda free light chains     5.7 - 26.3 mg/L 19.0  Kappa, lamda light chain ratio     0.26 - 1.65 1.25  Beta-2 Microglobulin     0.6 - 2.4 mg/L 1.8  Ferritin     24 -  336 ng/mL 72  DNA Mutation Analysis      Comment  Sed Rate     0 - 16 mm/hr 14    . CBC Latest Ref Rng & Units 03/09/2019 07/31/2018 01/29/2018  WBC 4.0 - 10.5 K/uL 8.3 12.4(H) 19.0(H)  Hemoglobin 13.0 - 17.0  g/dL 14.6 15.4 14.1  Hematocrit 39.0 - 52.0 % 42.2 45.9 40.5  Platelets 150 - 400 K/uL 220 258 249    . CMP Latest Ref Rng & Units 03/09/2019 01/28/2019 07/31/2018  Glucose 70 - 99 mg/dL 97 - 118(H)  BUN 8 - 23 mg/dL 15 - 13  Creatinine 0.61 - 1.24 mg/dL 0.91 - 0.75  Sodium 135 - 145 mmol/L 136 - 138  Potassium 3.5 - 5.1 mmol/L 3.9 - 3.6  Chloride 98 - 111 mmol/L 102 - 101  CO2 22 - 32 mmol/L 26 - 24  Calcium 8.9 - 10.3 mg/dL 8.7(L) - 8.9  Total Protein 6.5 - 8.1 g/dL 7.7 7.3 7.9  Total Bilirubin 0.3 - 1.2 mg/dL 0.5 - 0.7  Alkaline Phos 38 - 126 U/L 54 - 46  AST 15 - 41 U/L 27 - 32  ALT 0 - 44 U/L 22 - 24   Hemochromatosis DNA, PCR Order: [id number] Status:  Final result Visible to patient:  Yes (MyChart) Next appt:  04/21/2019 at 12:50 PM in Radiology (GI-315 MR 1) Dx:  Monoclonal paraproteinemia; Hemochrom... Component 74mo ago  DNA Mutation Analysis Comment   Comment: (NOTE)  Result: AFFECTED  Two copies of the same mutation (C282Y and C282Y) identified         RADIOGRAPHIC STUDIES: I have personally reviewed the radiological images as listed and agreed with the findings in the report. No results found.  ASSESSMENT & PLAN:  63 y.o. male with  1. 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  PLAN: -Discussed most recent labs from 03/09/2019; M spike was stable, blood counts were stable, blood chemistries were stable, K:L ratio was normal -Based on lab results, appears to be MGUS -Workup is not concerning for myeloma -Discussed the CRAB criteria with the pt: no hypercalcemia, normal renal function, no anemia, no current concern for bone tumors -Discussed again that MGUS causing neuropathy though less likely only be proven by a nerve biopsy. I do not feel strongly about this but will defer this decision to his neurologist. -Discussed that the likelihood of MGUS progressing to MM is 1-2% per year. After 6 month labs and follow up, aim to switch to one year  follow ups. -Continue with scheduled colonoscopy -Will see the pt back in 6 months  2. 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 Lab Results  Component Value Date   IRON 89 03/09/2019   TIBC 271 03/09/2019   IRONPCTSAT 33 03/09/2019   (Iron and TIBC)  Lab Results  Component Value Date   FERRITIN 72 03/09/2019   -no indication for therapeutic phlebotomy at this time. -Discussed that it is interesting that ferritin is so low because hemochromatosis usually causes increased ferritin w/o phlebotomies  -will monitor - goal would be to keep ferritin levels under 100.  -RTC with Dr Irene Limbo with labs in 6 months (plz schedule labs 1 week prior to clinic visit)  3. +ve cologard testing -patient has already been scheduled for colonoscopy  -RTC with Dr Irene Limbo with labs in 6 months (plz schedule labs 1 week prior to clinic  visit)   All of the patients questions were answered with apparent satisfaction. The patient knows to call the clinic with any problems, questions or concerns.  The total time spent in the appt was 30 minutes and more than 50% was on counseling and direct patient cares.   Sullivan Lone MD Reid Hope King AAHIVMS [facility] Hematology/Oncology Physician [facility]  (Office):       [phone] (Work cell):  [phone] (Fax):           [phone]  04/07/2019 2:29 PM  I, De Burrs, am acting as a scribe for Dr. Irene Limbo  .I have reviewed the above documentation for accuracy and completeness, and I agree with the above. Brunetta Genera MD

## 2019-04-07 ENCOUNTER — Inpatient Hospital Stay: Payer: Medicare HMO | Attending: Hematology | Admitting: Hematology

## 2019-04-07 DIAGNOSIS — D472 Monoclonal gammopathy: Secondary | ICD-10-CM | POA: Diagnosis not present

## 2019-04-08 ENCOUNTER — Telehealth: Payer: Self-pay | Admitting: Hematology

## 2019-04-08 NOTE — Telephone Encounter (Signed)
Scheduled appt per 7/28 los. ° °Printed and mailed appt calendar. °

## 2019-04-15 ENCOUNTER — Encounter: Payer: Self-pay | Admitting: Hematology

## 2019-04-21 ENCOUNTER — Other Ambulatory Visit: Payer: Self-pay

## 2019-04-21 ENCOUNTER — Ambulatory Visit
Admission: RE | Admit: 2019-04-21 | Discharge: 2019-04-21 | Disposition: A | Discharge status: Home / Self Care | Payer: Medicare HMO | Source: Ambulatory Visit | Attending: Neurosurgery | Admitting: Neurosurgery

## 2019-04-21 DIAGNOSIS — G8929 Other chronic pain: Secondary | ICD-10-CM

## 2019-04-21 DIAGNOSIS — M545 Low back pain, unspecified: Secondary | ICD-10-CM

## 2019-04-21 DIAGNOSIS — M542 Cervicalgia: Secondary | ICD-10-CM

## 2019-04-21 MED ORDER — GADOBENATE DIMEGLUMINE 529 MG/ML IV SOLN
20.0000 mL | Freq: Once | INTRAVENOUS | Status: AC | PRN
  Administered 2019-04-21: 20 mL via INTRAVENOUS

## 2019-05-07 ENCOUNTER — Encounter: Payer: Self-pay | Admitting: Hematology

## 2019-06-05 ENCOUNTER — Ambulatory Visit: Payer: Medicare HMO | Admitting: Gastroenterology

## 2019-06-05 ENCOUNTER — Encounter: Payer: Self-pay | Admitting: Gastroenterology

## 2019-06-05 VITALS — BP 142/88 | HR 74 | Temp 98.9°F | Ht 74.0 in | Wt 304.0 lb

## 2019-06-05 DIAGNOSIS — K5909 Other constipation: Secondary | ICD-10-CM | POA: Diagnosis not present

## 2019-06-05 DIAGNOSIS — R195 Other fecal abnormalities: Secondary | ICD-10-CM

## 2019-06-05 DIAGNOSIS — K5903 Drug induced constipation: Secondary | ICD-10-CM

## 2019-06-05 MED ORDER — SUPREP BOWEL PREP KIT 17.5-3.13-1.6 GM/177ML PO SOLN: 1.0000 | ORAL | 0 refills | Status: DC

## 2019-06-05 NOTE — Progress Notes (Signed)
Chief Complaint: Positive Cologuard  Referring Provider:     Daryll Brod, MD   HPI:    Sean Arnold is a 63 y.o. 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 Fib s/p many cardioversions, LKR, C5-C6 fusion followed by demolition, referred to the Gastroenterology Clinic for evaluation of + Cologuard from 02/2019.   For his Hemochromatosis, he follows with Dr. Irene Limbo in the Hematology clinic.Marland Kitchen  No recent indication for therapeutic phlebotomy given low ferritin (72).  Normal CBC and CMP in 02/2019.  Elevated ESR at 41 and 02/2019, then normal (14) in 04/2019.  No history of hepatic synthetic dysfunction.  He is otherwise without any active GI complaints today. Denies any hematochezia. Did have a negative Cologuard in 2013, but then colonoscopy in 08/2012 in [place], [place] (records requested). Reports it was a suboptimal prep and small polyps removed. Colonoscopy in 2008 was unrevealing per patient. Prior to that, was diagnosed with proctitis years ago when he was cycling 8000 miles/year. Those sxs have since resolved years ago.   Chronic constipation (OIC), and can go up to 2 weeks w/o BM with pellet like stools. Has been on opioids for 4+ years with subsequent constipation. Generally improved with Movantik prn. Not as much improvement with Relistor in the past.  Does not take any laxatives.  Has actually started smoking within the last 3 years, up to 1 pack per week, since divorce.   Had gastric sleeve in 2012 with 100# wt loss and improved exercise tolerance.   Planning lumbar fusion in the near future for stenosis and b/l LE neuropathy.   Endoscopic history: -Colonoscopy approximately 7 years ago notable for several benign polyps   Past Medical History:  Diagnosis Date  . A-fib ([place])   . Anxiety   . Arthritis   . 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 Complication of anesthesia   . Depression   .  Dysrhythmia    a-Fib    ablation  . Hypertension   . 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 Neuromuscular disorder (HCC)    neuropathy  both legs and feet  . Peripheral neuropathy 04/02/2019  . PONV (postoperative nausea and vomiting)    Severe  nausea  . Sleep apnea    CPAP      Past Surgical History:  Procedure Laterality Date  . ablation for atrial fib    . ACHILLES TENDON REPAIR    . arthroscopic knee Bilateral   . BACK SURGERY     times four  . CERVICAL FUSION    . LAMINECTOMY    . LAPAROSCOPIC GASTRIC SLEEVE RESECTION    . TOTAL KNEE ARTHROPLASTY Left 01/27/2018   Procedure: LEFT TOTAL KNEE ARTHROPLASTY;  Surgeon: Vickey Huger, MD;  Location: [place];  Service: Orthopedics;  Laterality: Left;   Family History  Problem Relation Age of Onset  . GI problems Mother   . Pneumonia Father   . Atrial fibrillation Father    Social History   Tobacco Use  . Smoking status: Current Some Day Smoker    Years: 40.00    Types: Cigarettes  . Smokeless tobacco: Never Used  Substance Use Topics  . Alcohol use: Yes    Comment: rarely  . Drug use: Never   Current Outpatient Medications  Medication Sig Dispense Refill  . acetaminophen (TYLENOL) 500 MG tablet  Take 250 mg by mouth every 6 (six) hours as needed (for pain.).    Marland Kitchen aspirin EC 325 MG EC tablet Take 1 tablet (325 mg total) by mouth 2 (two) times daily. 30 tablet 0  . lisinopril-hydrochlorothiazide (PRINZIDE,ZESTORETIC) 20-12.5 MG tablet Take 1 tablet by mouth 2 (two) times daily.    . Methylnaltrexone Bromide (RELISTOR) 150 MG TABS Take 150 mg by mouth as needed.     . Naloxegol Oxalate (MOVANTIK PO) Take by mouth as needed.    . Oxycodone HCl 10 MG TABS Take 10 mg by mouth 4 (four) times daily.      No current facility-administered medications for this visit.    Allergies  Allergen Reactions  . Morphine And Related Anaphylaxis and Shortness Of Breath    Breathing Distress (patient  states it was only because of large dose)     Review of Systems: All systems reviewed and negative except where noted in HPI.     Physical Exam:    Wt Readings from Last 3 Encounters:  06/05/19 (!) 304 lb (137.9 kg)  04/02/19 (!) 307 lb 8 oz (139.5 kg)  03/09/19 (!) 313 lb 8 oz (142.2 kg)    BP (!) 142/88 (BP Location: Right Arm, Patient Position: Sitting, Cuff Size: Normal) Comment (Patient Position): right forearm  Pulse 74   Temp 98.9 F (37.2 C) (Oral)   Ht 6' 2"  (1.88 m)   Wt (!) 304 lb (137.9 kg)   BMI 39.03 kg/m  Constitutional:  Pleasant, in no acute distress. Psychiatric: Normal mood and affect. Behavior is normal. EENT: Pupils normal.  Conjunctivae are normal. No scleral icterus. Neck supple. No cervical LAD. Cardiovascular: Normal rate, regular rhythm. No edema Pulmonary/chest: Effort normal and breath sounds normal. No wheezing, rales or rhonchi. Abdominal: Soft, nondistended, nontender. Bowel sounds active throughout. There are no masses palpable. No hepatomegaly. Neurological: Alert and oriented to person place and time. Skin: Skin is warm and dry. No rashes noted.   ASSESSMENT AND PLAN;   1) Positive Cologuard - Colonoscopy -Plan for 2-day bowel prep  2) Chronic constipation/Opioid Induced Constipation: -Discussed the likely multifactorial nature of his chronic constipation, to include OIC and possible neurogenic component -Evaluate for additional mucosal/luminal etiology at time of colonoscopy as above -Continue Movantik -Okay to add MiraLAX and titrate to soft stools without straining to have BM  3) History of Proctitis: -Reports a history of proctitis when he was an avid cyclist.  Has had no symptoms in nearly 20 years.  Can certainly evaluate for proctitis at time of colonoscopy  4) History of cervical and lumbar spine disease -Follows with neurosurgery -Discussed the possible neurogenic component of lower GI symptoms  The indications, risks,  and benefits of colonoscopy were explained to the patient in detail. Risks include but are not limited to bleeding, perforation, adverse reaction to medications, and cardiopulmonary compromise. Sequelae include but are not limited to the possibility of surgery, hospitalization, and mortality. The patient verbalized understanding and wished to proceed. All questions answered, referred to the scheduler and bowel prep ordered. Further recommendations pending results of the exam.     Lavena Bullion, DO, FACG  06/05/2019, 1:59 PM   Sun, Sharen Counter, MD

## 2019-06-05 NOTE — Patient Instructions (Signed)
You have been scheduled for a colonoscopy. Please follow written instructions given to you at your visit today.  Please pick up your prep supplies at the pharmacy within the next 1-3 days. If you use inhalers (even only as needed), please bring them with you on the day of your procedure. Your physician has requested that you go to www.startemmi.com and enter the access code given to you at your visit today. This web site gives a general overview about your procedure. However, you should still follow specific instructions given to you by our office regarding your preparation for the procedure.  Please purchase the following medications over the counter and take as directed: Miralax 17 grams (1 capful) dissolved in 8 oz water/juice once daily  If you are age 58 or older, your body mass index should be between 23-30. Your Body mass index is 39.03 kg/m. If this is out of the aforementioned range listed, please consider follow up with your Primary Care Provider.  If you are age 74 or younger, your body mass index should be between 19-25. Your Body mass index is 39.03 kg/m. If this is out of the aformentioned range listed, please consider follow up with your Primary Care Provider.

## 2019-06-08 ENCOUNTER — Telehealth: Payer: Self-pay | Admitting: Gastroenterology

## 2019-06-08 NOTE — Telephone Encounter (Signed)
Also patient said that he took diclofenac 75 mg on Friday and wants to ensure that its not going to effect his procedure.

## 2019-06-08 NOTE — Telephone Encounter (Signed)
I called and explained to the patient that its our policy for the patient to have someone there with him. Patient is very adamant that he feels that we should make an exception for him regarding this. He ask that I run this by you, please advise.

## 2019-06-08 NOTE — Telephone Encounter (Signed)
Pt is scheduled for a 06/10/19 colon and reported that he is new to the area and has no one to stay during the procedure.  Pt asked if Dr. Bryan Lemma can make an exception to have his sister available by phone.    Pt also inquired whether to stop talking diclofenac prior to procedure.

## 2019-06-09 ENCOUNTER — Telehealth: Payer: Self-pay

## 2019-06-09 NOTE — Telephone Encounter (Addendum)
I have called the patient and spoke to him about the policy and for his need to have someone present at the time of his procedure. Patient was very upset that we would not make an exception for this. Patient said that he hopes that the doctor and myself was ready to go to court because he feels like he has colon cancer and we are denying him treatment and delaying his care due to our policy. Patient also said that one day I would probably die alone and I would know how he feels right now.  I have tried to talk to patient and deescalate this situation.  I have given the patient options and contact numbers for [facility] and [facility] to call and see if he can arrange someone to be there with him. I have instructed patient to call us back to cancel his procedure if he is not able to arrange this.

## 2019-06-09 NOTE — Telephone Encounter (Signed)
Thank you.  Yes, we have been assisting him in this issue this morning, and issue has been resolved with care partner secured for tomorrow's procedure.

## 2019-06-09 NOTE — Telephone Encounter (Signed)
Agree, it is our policy that he needs someone present with him during check-in and who is able to escort him home after the procedure. We can reschedule for a date that he is able to secure someone to assist him.

## 2019-06-09 NOTE — Telephone Encounter (Addendum)
I initially called this patient for covid screening. Per pt he is unable to have a care partner present for his procedure. He states that it is an absurd policy, and that if he ends up with colon cancer due to his positive cologuard results "we will end up in court." Patient was very angry and stated multiple times "we did not know what we were doing."

## 2019-06-10 ENCOUNTER — Other Ambulatory Visit: Payer: Self-pay

## 2019-06-10 ENCOUNTER — Ambulatory Visit (AMBULATORY_SURGERY_CENTER): Payer: Medicare HMO | Admitting: Gastroenterology

## 2019-06-10 ENCOUNTER — Encounter: Payer: Self-pay | Admitting: Gastroenterology

## 2019-06-10 ENCOUNTER — Encounter: Payer: Medicare HMO | Admitting: Gastroenterology

## 2019-06-10 VITALS — BP 142/72 | HR 62 | Temp 98.9°F | Resp 20 | Ht 74.0 in | Wt 304.0 lb

## 2019-06-10 DIAGNOSIS — K621 Rectal polyp: Secondary | ICD-10-CM

## 2019-06-10 DIAGNOSIS — K5903 Drug induced constipation: Secondary | ICD-10-CM

## 2019-06-10 DIAGNOSIS — R195 Other fecal abnormalities: Secondary | ICD-10-CM | POA: Diagnosis not present

## 2019-06-10 DIAGNOSIS — K6289 Other specified diseases of anus and rectum: Secondary | ICD-10-CM

## 2019-06-10 MED ORDER — SODIUM CHLORIDE 0.9 % IV SOLN: 500.0000 mL | Freq: Once | INTRAVENOUS | Status: DC

## 2019-06-10 NOTE — Op Note (Signed)
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 No overt GI blood                            loss. Medicines:                Monitored Anesthesia Care Procedure:                Pre-Anesthesia Assessment:                           - Prior to the procedure, a History and Physical                            was performed, and patient medications and                            allergies were reviewed. The patient's tolerance of                            previous anesthesia was also reviewed. The risks                            and benefits of the procedure and the sedation                            options and risks were discussed with the patient.                            All questions were answered, and informed consent                            was obtained. Prior Anticoagulants: The patient has                            taken no previous anticoagulant or antiplatelet  agents. ASA Grade Assessment: III - A patient with                            severe systemic disease. After reviewing the risks                            and benefits, the patient was deemed in                            satisfactory condition to undergo the procedure.                           After obtaining informed consent, the colonoscope                            was passed under direct vision. Throughout the                            procedure,  the patient's blood pressure, pulse, and                            oxygen saturations were monitored continuously. The                            Colonoscope was introduced through the anus and                            advanced to the the terminal ileum. The colonoscopy                            was performed without difficulty. The patient                            tolerated the procedure well. The quality of the                            bowel preparation was fair. The terminal ileum,                            ileocecal valve, appendiceal orifice, and rectum                            were photographed. Scope In: 11:19:13 AM Scope Out: 11:36:29 AM Scope Withdrawal Time: 0 hours 14 minutes 32 seconds  Total Procedure Duration: 0 hours 17 minutes 16 seconds  Findings:                 The perianal and digital rectal examinations were                            normal.                           A localized area of mildly erythematous mucosa was  found in the distal 1-2 cm of the rectum. No ulcers                            or erosions noted. This was biopsied with a cold                            forceps for histology. Estimated blood loss was                            minimal.                           A moderate amount of semi-solid stool was found in                            the entire colon, interfering with visualization.                            Lavage of the area was performed using copious                            amounts of sterile water, resulting in clearance                            with fair visualization.                           The visualized mucosa was otherwise normal                            appearing throughout the remainder of the colon.                            There were some areas that could not be fully                            visualized due to adherent or solid stool debris,                            and  therefore cannot fully rule out the presence of                            small or flat polyps, particularly in the right                            colon.                           The retroflexed view of the distal rectum and anal                            verge was otherwise normal and showed no anal or  rectal abnormalities.                           The terminal ileum appeared normal. Complications:            No immediate complications. Estimated Blood Loss:     Estimated blood loss was minimal. Impression:               - Preparation of the colon was fair.                           - Erythematous mucosa in the distal rectum.                            Biopsied.                           - Stool in the entire examined colon.                           - The visualized mucosa was otherwise normal                            appearing throughout the remainder of the colon.                            There were some areas that could not be fully                            visualized due to adherent or solid stool debris,                            and therefore cannot fully rule out the presence of                            small or flat polyps in these areas, particularly                            in the right colon.                           - The distal rectum and anal verge are normal on                            retroflexion view.                           - The examined portion of the ileum was normal. Recommendation:           - Patient has a contact number available for                            emergencies. The signs and symptoms of potential                            delayed complications were discussed  with the                            patient. Return to normal activities tomorrow.                            Written discharge instructions were provided to the                            patient.                           - Resume previous  diet.                           - Continue present medications.                           - Await pathology results.                           - Based on current societal guideline                            recommendations, repeat colonoscopy in 1 year                            because the bowel preparation was suboptimal.                           - Recommend full 2-day prep with any repeat                            colonoscopy, to include clear liquid diet x2 days,                            Miralax and Dulcolax 2 days prior to procedure,                            followed by traditional bowel preparation on the                            day prior to the procedure.                           - Return to GI clinic PRN. Gerrit Heck, MD 06/10/2019 11:47:13 AM

## 2019-06-10 NOTE — Progress Notes (Signed)
Called to room to assist during endoscopic procedure.  Patient ID and intended procedure confirmed with present staff. Received instructions for my participation in the procedure from the performing physician.  

## 2019-06-10 NOTE — Patient Instructions (Signed)
Await pathology results.   Dr. Bryan Lemma will contact you to repeat the colonoscopy in 1 year with a 2 day prep.  YOU HAD AN ENDOSCOPIC PROCEDURE TODAY AT [facility]:   Refer to the procedure report that was given to you for any specific questions about what was found during the examination.  If the procedure report does not answer your questions, please call your gastroenterologist to clarify.  If you requested that your care partner not be given the details of your procedure findings, then the procedure report has been included in a sealed envelope for you to review at your convenience later.  YOU SHOULD EXPECT: Some feelings of bloating in the abdomen. Passage of more gas than usual.  Walking can help get rid of the air that was put into your GI tract during the procedure and reduce the bloating. If you had a lower endoscopy (such as a colonoscopy or flexible sigmoidoscopy) you may notice spotting of blood in your stool or on the toilet paper. If you underwent a bowel prep for your procedure, you may not have a normal bowel movement for a few days.  Please Note:  You might notice some irritation and congestion in your nose or some drainage.  This is from the oxygen used during your procedure.  There is no need for concern and it should clear up in a day or so.  SYMPTOMS TO REPORT IMMEDIATELY:   Following lower endoscopy (colonoscopy or flexible sigmoidoscopy):  Excessive amounts of blood in the stool  Significant tenderness or worsening of abdominal pains  Swelling of the abdomen that is new, acute  Fever of 100F or higher  For urgent or emergent issues, a gastroenterologist can be reached at any hour by calling [phone].   DIET:  We do recommend a small meal at first, but then you may proceed to your regular diet.  Drink plenty of fluids but you should avoid alcoholic beverages for 24 hours.  ACTIVITY:  You should plan to take it easy for the rest of today and you  should NOT DRIVE or use heavy machinery until tomorrow (because of the sedation medicines used during the test).    FOLLOW UP: Our staff will call the number listed on your records 48-72 hours following your procedure to check on you and address any questions or concerns that you may have regarding the information given to you following your procedure. If we do not reach you, we will leave a message.  We will attempt to reach you two times.  During this call, we will ask if you have developed any symptoms of COVID 19. If you develop any symptoms (ie: fever, flu-like symptoms, shortness of breath, cough etc.) before then, please call [phone].  If you test positive for Covid 19 in the 2 weeks post procedure, please call and report this information to [place].    If any biopsies were taken you will be contacted by phone or by letter within the next 1-3 weeks.  Please call us at [phone] if you have not heard about the biopsies in 3 weeks.    SIGNATURES/CONFIDENTIALITY: You and/or your care partner have signed paperwork which will be entered into your electronic medical record.  These signatures attest to the fact that that the information above on your After Visit Summary has been reviewed and is understood.  Full responsibility of the confidentiality of this discharge information lies with you and/or your care-partner.

## 2019-06-10 NOTE — Progress Notes (Signed)
To PACU, VSS. Report to [email or web address] 

## 2019-06-10 NOTE — Progress Notes (Signed)
Temp taken by KA VS taken by CW 

## 2019-06-12 ENCOUNTER — Telehealth: Payer: Self-pay | Admitting: *Deleted

## 2019-06-12 ENCOUNTER — Encounter: Payer: Self-pay | Admitting: Gastroenterology

## 2019-06-12 NOTE — Telephone Encounter (Signed)
Message left

## 2019-06-12 NOTE — Telephone Encounter (Signed)
  Follow up Call-  Call back number 06/10/2019  Post procedure Call Back phone  # [phone]  Permission to leave phone message Yes     Patient questions:  Do you have a fever, pain , or abdominal swelling? No. Pain Score  0 *  Have you tolerated food without any problems? Yes.    Have you been able to return to your normal activities? Yes.    Do you have any questions about your discharge instructions: Diet   No. Medications  No. Follow up visit  No.  Do you have questions or concerns about your Care? No.  Actions: * If pain score is 4 or above: No action needed, pain <4. 1. Have you developed a fever since your procedure? no  2.   Have you had an respiratory symptoms (SOB or cough) since your procedure? no  3.   Have you tested positive for COVID 19 since your procedure no  4.   Have you had any family members/close contacts diagnosed with the COVID 19 since your procedure?  no   If yes to any of these questions please route to Joylene John, RN and Alphonsa Gin, Therapist, sports.

## 2019-10-01 ENCOUNTER — Other Ambulatory Visit: Payer: Medicare HMO

## 2019-10-01 ENCOUNTER — Telehealth: Payer: Self-pay | Admitting: Hematology

## 2019-10-01 NOTE — Telephone Encounter (Signed)
Returned patient's phone call regarding cancelling 02/21 appointment, per 01/21 scheduled message appointment has been cancelled.

## 2019-10-08 ENCOUNTER — Ambulatory Visit: Payer: Medicare HMO | Admitting: Hematology

## 2020-08-16 IMAGING — CR DG ABDOMEN ACUTE W/ 1V CHEST
4 series · 4 of 4 positions shown · non-contrast
Comparison: None.

CLINICAL DATA: Sudden onset epigastric pain this morning. History
of gastric bypass surgery. Nausea.

EXAM:
DG ABDOMEN ACUTE W/ 1V CHEST

[w chest pa]
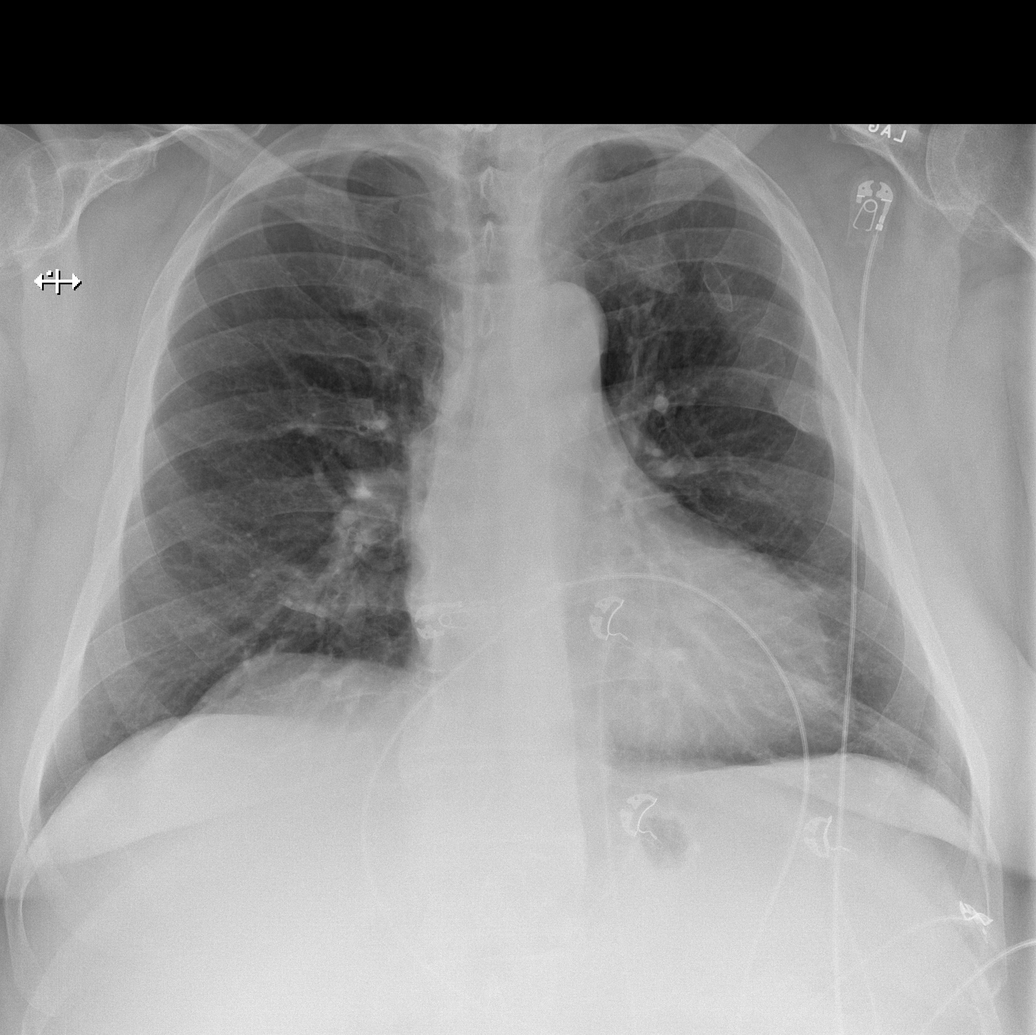

[w abdomen upright]
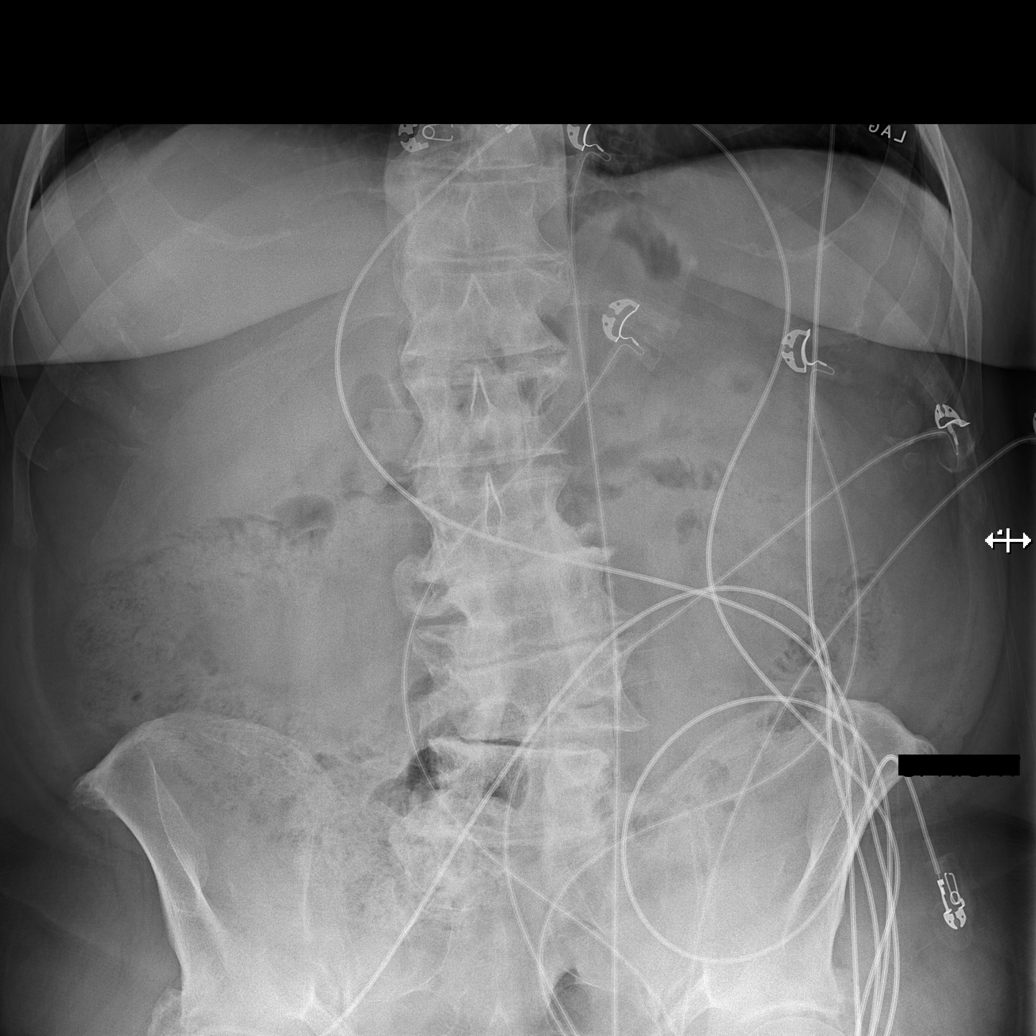

[t abdomen supine (1 of 2)]
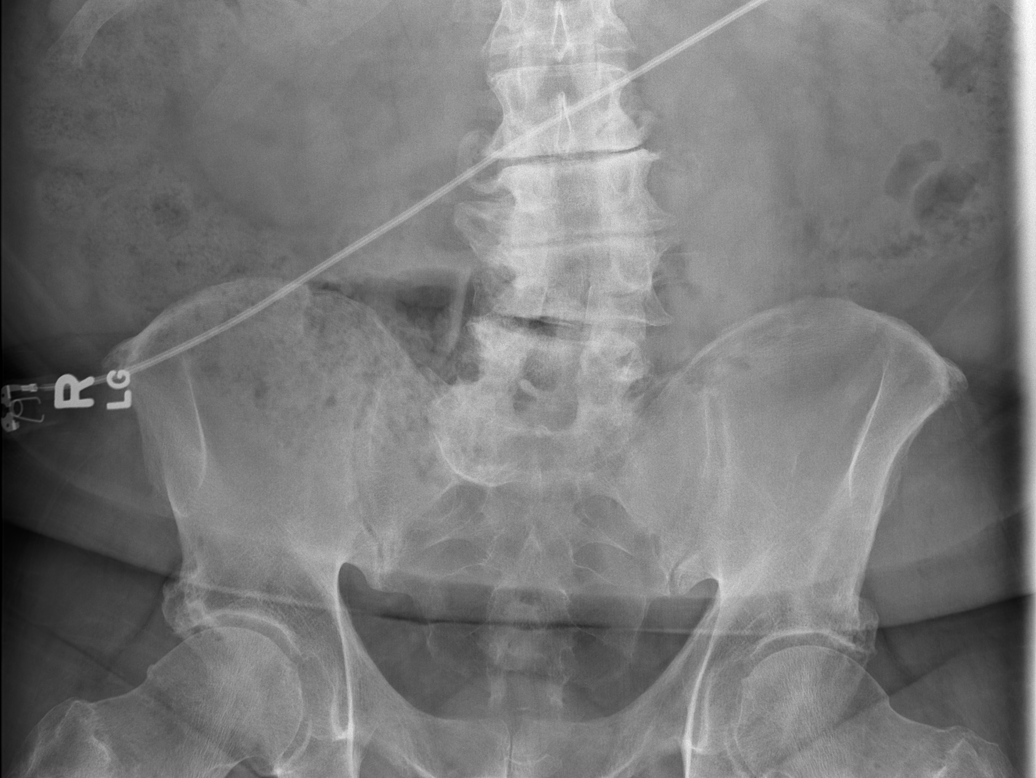

[t abdomen supine (2 of 2)]
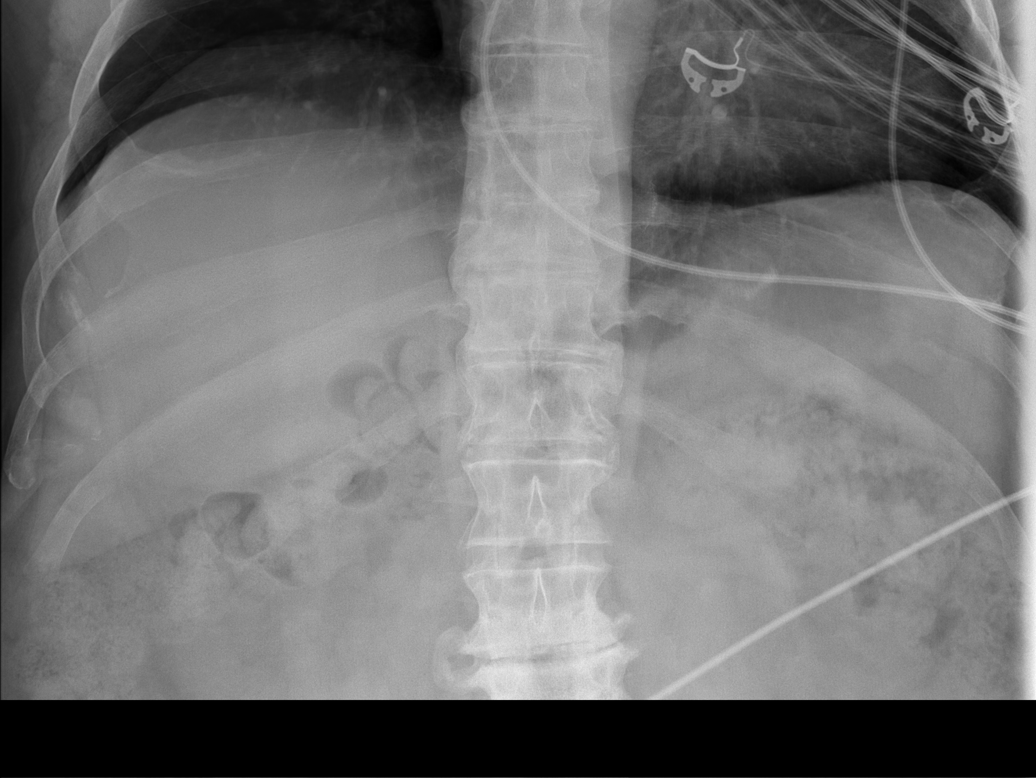

[4 of 4 positions shown; findings below may reference images not displayed]

FINDINGS: Normal heart size and pulmonary vascularity. No focal airspace
disease or consolidation in the lungs. No blunting of costophrenic
angles. No pneumothorax. Mediastinal contours appear intact.
Postoperative changes in the cervical spine. Multiple old left rib
fractures.

Gas and stool throughout the colon. No small or large bowel
distention. No free intra-abdominal air. No abnormal air-fluid
levels. No radiopaque stones. Degenerative changes in the spine and
hips. Postoperative L3 through L5 lumbar laminectomies. Soft tissue
contours appear intact.
IMPRESSION: 1. No evidence of active pulmonary disease.
2. Nonobstructive bowel gas pattern.

## 2020-09-30 ENCOUNTER — Other Ambulatory Visit: Payer: Self-pay

## 2020-10-03 ENCOUNTER — Other Ambulatory Visit: Payer: Self-pay

## 2020-10-03 ENCOUNTER — Ambulatory Visit (INDEPENDENT_AMBULATORY_CARE_PROVIDER_SITE_OTHER): Payer: Medicare HMO | Admitting: Internal Medicine

## 2020-10-03 ENCOUNTER — Encounter: Payer: Self-pay | Admitting: Internal Medicine

## 2020-10-03 VITALS — BP 134/80 | HR 79 | Temp 99.6°F | Resp 18 | Ht 74.0 in | Wt 302.0 lb

## 2020-10-03 DIAGNOSIS — Z720 Tobacco use: Secondary | ICD-10-CM

## 2020-10-03 DIAGNOSIS — D472 Monoclonal gammopathy: Secondary | ICD-10-CM

## 2020-10-03 DIAGNOSIS — Z8679 Personal history of other diseases of the circulatory system: Secondary | ICD-10-CM

## 2020-10-03 DIAGNOSIS — M549 Dorsalgia, unspecified: Secondary | ICD-10-CM | POA: Diagnosis not present

## 2020-10-03 DIAGNOSIS — R69 Illness, unspecified: Secondary | ICD-10-CM | POA: Diagnosis not present

## 2020-10-03 DIAGNOSIS — G609 Hereditary and idiopathic neuropathy, unspecified: Secondary | ICD-10-CM | POA: Diagnosis not present

## 2020-10-03 DIAGNOSIS — G8929 Other chronic pain: Secondary | ICD-10-CM

## 2020-10-03 DIAGNOSIS — G540 Brachial plexus disorders: Secondary | ICD-10-CM

## 2020-10-03 DIAGNOSIS — K5903 Drug induced constipation: Secondary | ICD-10-CM

## 2020-10-03 DIAGNOSIS — F1120 Opioid dependence, uncomplicated: Secondary | ICD-10-CM

## 2020-10-03 DIAGNOSIS — T402X5A Adverse effect of other opioids, initial encounter: Secondary | ICD-10-CM | POA: Diagnosis not present

## 2020-10-03 NOTE — Progress Notes (Signed)
Subjective:   Patient ID: Sean Arnold, male    DOB: 10/27/1955, 65 y.o.   MRN: [id number]  HPI The patient is a new 65 YO man coming in for new continuation of medical care including chronic pain (due to pain in back and neck, on disability for this, multiple prior back and neck orthopedic procedures, previously was on oxycodone 15 mg QID and has self decided to work with prior provider to reduce, is now taking 10 mg TID and this is okay, he tried to jump from 10 mg QID to 5 mg TID and did have some withdrawal symptoms, also uses diclofenac and this helps well, does have constipation associated, started on oxycodone about 5 years ago and this was the first pain medication tried) and tobacco abuse (used to smoke rare cigars and now is a sometime smoker, pack lasts about 1-2 weeks, overall would like to stop, denies chronic cough, some SOB with exertion he does not feel is related to smoking) and essential hypertension (taking lisinopril/hctz 20/12.5 mg daily, denies side effects, no recent labs, denies chest pains or headaches), and opioid induced constipation (taking movantik which helps some, would like to reduce opioids to help, denies current change in bowels, going once a week or less, causing stomach distention and discomfort, still passing gas) and history of A fib (has had several ablations, no A fib recently, does not take medication for this currently, several years since last ablation).   Review of Systems  Constitutional: Negative.   HENT: Negative.   Eyes: Negative.   Respiratory: Positive for shortness of breath. Negative for cough and chest tightness.   Cardiovascular: Negative for chest pain, palpitations and leg swelling.  Gastrointestinal: Negative for abdominal distention, abdominal pain, constipation, diarrhea, nausea and vomiting.  Musculoskeletal: Positive for arthralgias and back pain.  Skin: Negative.   Neurological: Negative.   Psychiatric/Behavioral: Positive for  decreased concentration.    Objective:  Physical Exam Constitutional:      Appearance: He is well-developed and well-nourished.  HENT:     Head: Normocephalic and atraumatic.  Eyes:     Extraocular Movements: EOM normal.  Cardiovascular:     Rate and Rhythm: Normal rate and regular rhythm.  Pulmonary:     Effort: Pulmonary effort is normal. No respiratory distress.     Breath sounds: Normal breath sounds. No wheezing or rales.  Abdominal:     General: Bowel sounds are normal. There is no distension.     Palpations: Abdomen is soft.     Tenderness: There is no abdominal tenderness. There is no rebound.  Musculoskeletal:        General: Tenderness present. No edema.     Cervical back: Normal range of motion.  Skin:    General: Skin is warm and dry.  Neurological:     Mental Status: He is alert and oriented to person, place, and time.     Coordination: Coordination normal.  Psychiatric:        Mood and Affect: Mood and affect normal.     Vitals:   10/03/20 1504  BP: 134/80  Pulse: 79  Resp: 18  Temp: 99.6 F (37.6 C)  TempSrc: Oral  SpO2: 98%  Weight: (!) 302 lb (137 kg)  Height: 6\' 2"  (1.88 m)   EKG: Rate 75, axis normal, interval normal, QRS 495, RBBB, no st or t wave changes, no prior to compare in Epic, per patient reports sounds consistent with baseline  Visit time 65 minutes in face  to face communication with patient and coordination of care, additional 10 minutes spent in record review, coordination or care, ordering tests, communicating/referring to other healthcare professionals, documenting in medical records all on the same day of the visit for total time 75 minutes spent on the visit.    This visit occurred during the SARS-CoV-2 public health emergency.  Safety protocols were in place, including screening questions prior to the visit, additional usage of staff PPE, and extensive cleaning of exam room while observing appropriate contact time as indicated for  disinfecting solutions.   Assessment & Plan:

## 2020-10-03 NOTE — Patient Instructions (Signed)
We will check the labs today and the ECG was at your baseline.

## 2020-10-04 LAB — LIPID PANEL
Cholesterol: 140 mg/dL (ref 0–200)
HDL: 34.1 mg/dL — ABNORMAL LOW (ref >39.00)
LDL Cholesterol: 86 mg/dL (ref 0–99)
NonHDL: 106.26
Total CHOL/HDL Ratio: 4
Triglycerides: 103 mg/dL (ref 0.0–149.0)
VLDL: 20.6 mg/dL (ref 0.0–40.0)

## 2020-10-04 LAB — CBC
HCT: 43.5 % (ref 39.0–52.0)
Hemoglobin: 15 g/dL (ref 13.0–17.0)
MCHC: 34.5 g/dL (ref 30.0–36.0)
MCV: 85.2 fl (ref 78.0–100.0)
Platelets: 308 10*3/uL (ref 150.0–400.0)
RBC: 5.1 Mil/uL (ref 4.22–5.81)
RDW: 14.1 % (ref 11.5–15.5)
WBC: 7.9 10*3/uL (ref 4.0–10.5)

## 2020-10-04 LAB — COMPREHENSIVE METABOLIC PANEL
ALT: 15 U/L (ref 0–53)
AST: 23 U/L (ref 0–37)
Albumin: 4.2 g/dL (ref 3.5–5.2)
Alkaline Phosphatase: 62 U/L (ref 39–117)
BUN: 13 mg/dL (ref 6–23)
CO2: 28 mEq/L (ref 19–32)
Calcium: 9.3 mg/dL (ref 8.4–10.5)
Chloride: 101 mEq/L (ref 96–112)
Creatinine, Ser: 0.81 mg/dL (ref 0.40–1.50)
GFR: 93.96 mL/min (ref >60.00)
Glucose, Bld: 76 mg/dL (ref 70–99)
Potassium: 3.8 mEq/L (ref 3.5–5.1)
Sodium: 135 mEq/L (ref 135–145)
Total Bilirubin: 0.5 mg/dL (ref 0.2–1.2)
Total Protein: 8.2 g/dL (ref 6.0–8.3)

## 2020-10-04 LAB — HEMOGLOBIN A1C: Hgb A1c MFr Bld: 5.5 % (ref 4.6–6.5)

## 2020-10-05 DIAGNOSIS — Z720 Tobacco use: Secondary | ICD-10-CM | POA: Insufficient documentation

## 2020-10-05 DIAGNOSIS — Z8679 Personal history of other diseases of the circulatory system: Secondary | ICD-10-CM | POA: Insufficient documentation

## 2020-10-05 DIAGNOSIS — G8929 Other chronic pain: Secondary | ICD-10-CM | POA: Insufficient documentation

## 2020-10-05 DIAGNOSIS — F1120 Opioid dependence, uncomplicated: Secondary | ICD-10-CM | POA: Insufficient documentation

## 2020-10-05 DIAGNOSIS — K5903 Drug induced constipation: Secondary | ICD-10-CM | POA: Insufficient documentation

## 2020-10-05 DIAGNOSIS — T402X5A Adverse effect of other opioids, initial encounter: Secondary | ICD-10-CM | POA: Insufficient documentation

## 2020-10-05 NOTE — Assessment & Plan Note (Signed)
He is currently taking 10 mg oxycodone TID. Checking UDS today. Reviewed [place] narcotic database. Repeat visit in 1 month. Will refill 10 mg oxycodone #90 no refills until then once UDS returns. He has goal to get off of this medication and we will plan for reduction in dose gradually to avoid withdrawal symptoms. Taking diclofenac for back pain and spinal pain which seems to be helping a significant amount.

## 2020-10-05 NOTE — Assessment & Plan Note (Signed)
Taking movantik and the goal is to reduce opioid dosing to help with this long term.

## 2020-10-05 NOTE — Assessment & Plan Note (Signed)
Has been on oxycodone for pain for about 5 years and he would like goal of being off these medications. We will work with him on reduction plan. Continue diclofenac 75 mg BID for pain also during this time. Has tried and failed gabapentin for pain in the past.

## 2020-10-05 NOTE — Assessment & Plan Note (Signed)
After flu vaccine so added this to contraindication in health maintenance. This is overall improving.

## 2020-10-05 NOTE — Assessment & Plan Note (Signed)
EKG done today in sinus consistent with auscultation. Has had ablation in the past. Will continue to monitor for symptoms off medications.

## 2020-10-05 NOTE — Assessment & Plan Note (Signed)
Advised to quit and reminded about harm associated with tobacco smoke.

## 2020-10-05 NOTE — Assessment & Plan Note (Signed)
Checking CBC today and encouraged to follow up with heme/onc for repeat testing. He does have small risk of progression yearly.

## 2020-10-06 LAB — DRUG MONITORING, PANEL 8 WITH CONFIRMATION, URINE
6 Acetylmorphine: NEGATIVE ng/mL (ref <10)
Alcohol Metabolites: NEGATIVE ng/mL
Amphetamines: NEGATIVE ng/mL (ref <500)
Benzodiazepines: NEGATIVE ng/mL (ref <100)
Buprenorphine, Urine: NEGATIVE ng/mL (ref <5)
Cocaine Metabolite: NEGATIVE ng/mL (ref <150)
Codeine: NEGATIVE ng/mL (ref <50)
Creatinine: 71.8 mg/dL
Hydrocodone: NEGATIVE ng/mL (ref <50)
Hydromorphone: NEGATIVE ng/mL (ref <50)
MDMA: NEGATIVE ng/mL (ref <500)
Marijuana Metabolite: NEGATIVE ng/mL (ref <20)
Morphine: NEGATIVE ng/mL (ref <50)
Norhydrocodone: NEGATIVE ng/mL (ref <50)
Noroxycodone: 829 ng/mL — ABNORMAL HIGH (ref <50)
Opiates: NEGATIVE ng/mL (ref <100)
Oxidant: NEGATIVE ug/mL
Oxycodone: 581 ng/mL — ABNORMAL HIGH (ref <50)
Oxycodone: POSITIVE ng/mL — AB (ref <100)
Oxymorphone: 1116 ng/mL — ABNORMAL HIGH (ref <50)
pH: 7.1 (ref 4.5–9.0)

## 2020-10-06 LAB — DM TEMPLATE

## 2020-10-07 ENCOUNTER — Encounter: Payer: Self-pay | Admitting: Internal Medicine

## 2020-10-07 ENCOUNTER — Telehealth: Payer: Self-pay | Admitting: Internal Medicine

## 2020-10-07 MED ORDER — OXYCODONE HCL 10 MG PO TABS: 10.0000 mg | ORAL_TABLET | Freq: Three times a day (TID) | ORAL | 0 refills | Status: AC

## 2020-10-07 NOTE — Telephone Encounter (Signed)
Noted  

## 2020-10-07 NOTE — Telephone Encounter (Signed)
° °  Patient calling to request order for Oxycodone HCl 10 MG TABS. He is also requesting medication to help with sleeping. Patient states this was discussed during 1/24 visit  Pharmacy [id number] - [place], [place]. AT [address]

## 2020-10-07 NOTE — Telephone Encounter (Signed)
See below

## 2020-10-07 NOTE — Telephone Encounter (Signed)
Patient wondering about the sleep medication still

## 2020-10-07 NOTE — Telephone Encounter (Signed)
LVM for the patient to come pick up his medical records CD from [facility].   Per [facility] we are unable to retrieve these documents from the CD and the patient will need to complete a medical release of information form to have records sent to our office.

## 2020-10-07 NOTE — Telephone Encounter (Signed)
Sent in 5 day supply, per [place] law this is the most that can be send for 1st rx. He can call back next week for a 30 day fill. Keep follow up visit.

## 2020-10-10 NOTE — Telephone Encounter (Signed)
Patient reached out via [employer]. Message was forwarded to Dr. Sharlet Salina.

## 2020-10-12 DIAGNOSIS — M542 Cervicalgia: Secondary | ICD-10-CM | POA: Diagnosis not present

## 2020-10-12 DIAGNOSIS — G8929 Other chronic pain: Secondary | ICD-10-CM | POA: Diagnosis not present

## 2020-10-12 DIAGNOSIS — M549 Dorsalgia, unspecified: Secondary | ICD-10-CM | POA: Diagnosis not present

## 2020-10-12 DIAGNOSIS — E7800 Pure hypercholesterolemia, unspecified: Secondary | ICD-10-CM | POA: Diagnosis not present

## 2020-10-12 NOTE — Telephone Encounter (Signed)
Sharlet Salina has been removed from this patient as their PCP per Provider and patient.

## 2020-10-21 ENCOUNTER — Encounter: Payer: Self-pay | Admitting: Gastroenterology

## 2020-11-03 ENCOUNTER — Ambulatory Visit: Payer: Medicare HMO | Admitting: Internal Medicine

## 2020-11-17 DIAGNOSIS — E7800 Pure hypercholesterolemia, unspecified: Secondary | ICD-10-CM | POA: Diagnosis not present

## 2020-11-17 DIAGNOSIS — G8929 Other chronic pain: Secondary | ICD-10-CM | POA: Diagnosis not present

## 2020-11-17 DIAGNOSIS — M549 Dorsalgia, unspecified: Secondary | ICD-10-CM | POA: Diagnosis not present

## 2020-11-17 DIAGNOSIS — M542 Cervicalgia: Secondary | ICD-10-CM | POA: Diagnosis not present

## 2020-12-16 DIAGNOSIS — M542 Cervicalgia: Secondary | ICD-10-CM | POA: Diagnosis not present

## 2020-12-16 DIAGNOSIS — M549 Dorsalgia, unspecified: Secondary | ICD-10-CM | POA: Diagnosis not present

## 2020-12-16 DIAGNOSIS — G8929 Other chronic pain: Secondary | ICD-10-CM | POA: Diagnosis not present

## 2021-01-19 DIAGNOSIS — R69 Illness, unspecified: Secondary | ICD-10-CM | POA: Diagnosis not present

## 2021-01-19 DIAGNOSIS — Z1331 Encounter for screening for depression: Secondary | ICD-10-CM | POA: Diagnosis not present

## 2021-01-19 DIAGNOSIS — Z79899 Other long term (current) drug therapy: Secondary | ICD-10-CM | POA: Diagnosis not present

## 2021-01-19 DIAGNOSIS — M129 Arthropathy, unspecified: Secondary | ICD-10-CM | POA: Diagnosis not present

## 2021-01-19 DIAGNOSIS — F1020 Alcohol dependence, uncomplicated: Secondary | ICD-10-CM | POA: Diagnosis not present

## 2021-01-19 DIAGNOSIS — M542 Cervicalgia: Secondary | ICD-10-CM | POA: Diagnosis not present

## 2021-01-19 DIAGNOSIS — M549 Dorsalgia, unspecified: Secondary | ICD-10-CM | POA: Diagnosis not present

## 2021-01-19 DIAGNOSIS — G8929 Other chronic pain: Secondary | ICD-10-CM | POA: Diagnosis not present

## 2021-01-19 DIAGNOSIS — Z131 Encounter for screening for diabetes mellitus: Secondary | ICD-10-CM | POA: Diagnosis not present

## 2021-01-19 DIAGNOSIS — G4700 Insomnia, unspecified: Secondary | ICD-10-CM | POA: Diagnosis not present

## 2021-01-19 DIAGNOSIS — E559 Vitamin D deficiency, unspecified: Secondary | ICD-10-CM | POA: Diagnosis not present

## 2021-01-19 DIAGNOSIS — E7800 Pure hypercholesterolemia, unspecified: Secondary | ICD-10-CM | POA: Diagnosis not present

## 2021-01-19 DIAGNOSIS — R5383 Other fatigue: Secondary | ICD-10-CM | POA: Diagnosis not present

## 2021-03-10 DIAGNOSIS — R234 Changes in skin texture: Secondary | ICD-10-CM | POA: Diagnosis not present

## 2021-03-10 DIAGNOSIS — I10 Essential (primary) hypertension: Secondary | ICD-10-CM | POA: Diagnosis not present

## 2021-03-10 DIAGNOSIS — R69 Illness, unspecified: Secondary | ICD-10-CM | POA: Diagnosis not present

## 2021-03-10 DIAGNOSIS — M545 Low back pain, unspecified: Secondary | ICD-10-CM | POA: Diagnosis not present

## 2021-03-10 DIAGNOSIS — I872 Venous insufficiency (chronic) (peripheral): Secondary | ICD-10-CM | POA: Diagnosis not present

## 2021-03-10 DIAGNOSIS — G8929 Other chronic pain: Secondary | ICD-10-CM | POA: Diagnosis not present

## 2021-04-11 DIAGNOSIS — I10 Essential (primary) hypertension: Secondary | ICD-10-CM | POA: Diagnosis not present

## 2021-04-11 DIAGNOSIS — R5383 Other fatigue: Secondary | ICD-10-CM | POA: Diagnosis not present

## 2021-04-11 DIAGNOSIS — F17290 Nicotine dependence, other tobacco product, uncomplicated: Secondary | ICD-10-CM | POA: Insufficient documentation

## 2021-04-11 DIAGNOSIS — R600 Localized edema: Secondary | ICD-10-CM | POA: Diagnosis not present

## 2021-04-11 DIAGNOSIS — I480 Paroxysmal atrial fibrillation: Secondary | ICD-10-CM | POA: Diagnosis not present

## 2021-04-11 DIAGNOSIS — M5416 Radiculopathy, lumbar region: Secondary | ICD-10-CM | POA: Diagnosis not present

## 2021-04-11 DIAGNOSIS — G4700 Insomnia, unspecified: Secondary | ICD-10-CM | POA: Diagnosis not present

## 2021-04-11 DIAGNOSIS — R079 Chest pain, unspecified: Secondary | ICD-10-CM | POA: Diagnosis not present

## 2021-04-12 DIAGNOSIS — I4510 Unspecified right bundle-branch block: Secondary | ICD-10-CM | POA: Diagnosis not present

## 2021-04-18 DIAGNOSIS — R079 Chest pain, unspecified: Secondary | ICD-10-CM | POA: Diagnosis not present

## 2021-05-03 DIAGNOSIS — I480 Paroxysmal atrial fibrillation: Secondary | ICD-10-CM | POA: Diagnosis not present

## 2021-05-03 DIAGNOSIS — R079 Chest pain, unspecified: Secondary | ICD-10-CM | POA: Diagnosis not present

## 2021-05-03 DIAGNOSIS — I10 Essential (primary) hypertension: Secondary | ICD-10-CM | POA: Diagnosis not present

## 2021-05-03 DIAGNOSIS — G4733 Obstructive sleep apnea (adult) (pediatric): Secondary | ICD-10-CM | POA: Diagnosis not present

## 2021-05-03 DIAGNOSIS — G894 Chronic pain syndrome: Secondary | ICD-10-CM | POA: Diagnosis not present

## 2021-05-03 DIAGNOSIS — R69 Illness, unspecified: Secondary | ICD-10-CM | POA: Diagnosis not present

## 2021-05-03 DIAGNOSIS — Z9989 Dependence on other enabling machines and devices: Secondary | ICD-10-CM | POA: Diagnosis not present

## 2021-05-22 DIAGNOSIS — G4733 Obstructive sleep apnea (adult) (pediatric): Secondary | ICD-10-CM | POA: Diagnosis not present

## 2021-05-22 DIAGNOSIS — R079 Chest pain, unspecified: Secondary | ICD-10-CM | POA: Diagnosis not present

## 2021-05-22 DIAGNOSIS — I10 Essential (primary) hypertension: Secondary | ICD-10-CM | POA: Diagnosis not present

## 2021-05-22 DIAGNOSIS — I480 Paroxysmal atrial fibrillation: Secondary | ICD-10-CM | POA: Diagnosis not present

## 2021-05-24 DIAGNOSIS — I472 Ventricular tachycardia: Secondary | ICD-10-CM | POA: Diagnosis not present

## 2021-05-24 DIAGNOSIS — I492 Junctional premature depolarization: Secondary | ICD-10-CM | POA: Diagnosis not present

## 2021-05-24 DIAGNOSIS — I471 Supraventricular tachycardia: Secondary | ICD-10-CM | POA: Diagnosis not present

## 2021-06-26 DIAGNOSIS — I480 Paroxysmal atrial fibrillation: Secondary | ICD-10-CM | POA: Diagnosis not present

## 2021-06-26 DIAGNOSIS — R079 Chest pain, unspecified: Secondary | ICD-10-CM | POA: Diagnosis not present

## 2021-06-26 DIAGNOSIS — I10 Essential (primary) hypertension: Secondary | ICD-10-CM | POA: Diagnosis not present

## 2021-06-26 DIAGNOSIS — Z9989 Dependence on other enabling machines and devices: Secondary | ICD-10-CM | POA: Diagnosis not present

## 2021-06-26 DIAGNOSIS — R69 Illness, unspecified: Secondary | ICD-10-CM | POA: Diagnosis not present

## 2021-06-26 DIAGNOSIS — G4733 Obstructive sleep apnea (adult) (pediatric): Secondary | ICD-10-CM | POA: Diagnosis not present

## 2021-07-05 DIAGNOSIS — I480 Paroxysmal atrial fibrillation: Secondary | ICD-10-CM | POA: Diagnosis not present

## 2021-07-05 DIAGNOSIS — R69 Illness, unspecified: Secondary | ICD-10-CM | POA: Diagnosis not present

## 2021-07-05 DIAGNOSIS — Z9989 Dependence on other enabling machines and devices: Secondary | ICD-10-CM | POA: Diagnosis not present

## 2021-07-05 DIAGNOSIS — M4722 Other spondylosis with radiculopathy, cervical region: Secondary | ICD-10-CM | POA: Diagnosis not present

## 2021-07-05 DIAGNOSIS — Z96659 Presence of unspecified artificial knee joint: Secondary | ICD-10-CM | POA: Diagnosis not present

## 2021-07-05 DIAGNOSIS — R079 Chest pain, unspecified: Secondary | ICD-10-CM | POA: Diagnosis not present

## 2021-07-05 DIAGNOSIS — I10 Essential (primary) hypertension: Secondary | ICD-10-CM | POA: Diagnosis not present

## 2021-07-05 DIAGNOSIS — G894 Chronic pain syndrome: Secondary | ICD-10-CM | POA: Diagnosis not present

## 2021-07-05 DIAGNOSIS — G4733 Obstructive sleep apnea (adult) (pediatric): Secondary | ICD-10-CM | POA: Diagnosis not present

## 2021-07-31 ENCOUNTER — Other Ambulatory Visit: Payer: Self-pay

## 2021-07-31 ENCOUNTER — Encounter (HOSPITAL_BASED_OUTPATIENT_CLINIC_OR_DEPARTMENT_OTHER): Payer: Self-pay | Admitting: Nurse Practitioner

## 2021-07-31 ENCOUNTER — Ambulatory Visit (INDEPENDENT_AMBULATORY_CARE_PROVIDER_SITE_OTHER): Payer: Medicare HMO | Admitting: Nurse Practitioner

## 2021-07-31 VITALS — BP 120/80 | HR 63 | Ht 74.0 in | Wt 305.4 lb

## 2021-07-31 DIAGNOSIS — I10 Essential (primary) hypertension: Secondary | ICD-10-CM

## 2021-07-31 DIAGNOSIS — Z0000 Encounter for general adult medical examination without abnormal findings: Secondary | ICD-10-CM | POA: Diagnosis not present

## 2021-07-31 DIAGNOSIS — F5101 Primary insomnia: Secondary | ICD-10-CM | POA: Insufficient documentation

## 2021-07-31 DIAGNOSIS — I482 Chronic atrial fibrillation, unspecified: Secondary | ICD-10-CM | POA: Diagnosis not present

## 2021-07-31 DIAGNOSIS — R600 Localized edema: Secondary | ICD-10-CM | POA: Insufficient documentation

## 2021-07-31 DIAGNOSIS — Z1331 Encounter for screening for depression: Secondary | ICD-10-CM | POA: Insufficient documentation

## 2021-07-31 DIAGNOSIS — R69 Illness, unspecified: Secondary | ICD-10-CM | POA: Diagnosis not present

## 2021-07-31 MED ORDER — TRAZODONE HCL 100 MG PO TABS: 100.0000 mg | ORAL_TABLET | Freq: Every day | ORAL | 1 refills | Status: DC

## 2021-07-31 NOTE — Assessment & Plan Note (Signed)
BP well controlled today on current regimen Most recent labs 04/2021 reviewed today. No refills needed at this time, but OK to refill when needed.

## 2021-07-31 NOTE — Assessment & Plan Note (Signed)
Bilateral LE edema +2 with dryness and scaling present to the skin.  Right leg shows purpuric vertical striation-like marks on the surface of the calf and shins.  Unknown etiology at this time. Patient reports he recently had vascular study done that was normal.  Recommend routine moisturizer to LE and to prop legs up when seated to avoid pooling of fluid.  Increased walking can help with venous return.  Will monitor.

## 2021-07-31 NOTE — Patient Instructions (Addendum)
Thank you for choosing [facility] at [facility] for your Primary Care needs. I am excited for the opportunity to partner with you to meet your health care goals. It was a pleasure meeting you today!  Recommendations from today's visit: I have sent a prescription for trazodone to the pharmacy for you. You can take this about 30 minutes before bed time. We can always increase the dosage as needed for more efficacy, however, this is typically a good starting dose to help rest the mind enough to fall asleep. Let me know if this is not helpful.    Information on diet, exercise, and health maintenance recommendations are listed below. This is information to help you be sure you are on track for optimal health and monitoring.   Please look over this and let us know if you have any questions or if you have completed any of the health maintenance outside of [facility] so that we can be sure your records are up to date.  ___________________________________________________________ About Me: I am an Adult-Geriatric Nurse Practitioner with a background in caring for patients for more than 20 years with a strong intensive care background. I provide primary care and sports medicine services to patients age 35 and older within this office. My education had a strong focus on caring for the older adult population, which I am passionate about. I am also the director of the APP Fellowship with [facility].   My desire is to provide you with the best service through preventive medicine and supportive care. I consider you a part of the medical team and value your input. I work diligently to ensure that you are heard and your needs are met in a safe and effective manner. I want you to feel comfortable with me as your provider and want you to know that your health concerns are important to me.  For your information, our office hours are: Monday, Tuesday, and Thursday 8:00 AM - 5:00 PM Wednesday and  Friday 8:00 AM - 12:00 PM.   In my time away from the office I am teaching new APP's within the system and am unavailable, but my partner, Dr. Burnard Bunting is in the office for emergent needs.   If you have questions or concerns, please call our office at [phone] or send [place] a MyChart message and we will respond as quickly as possible.  ____________________________________________________________ MyChart:  For all urgent or time sensitive needs we ask that you please call the office to avoid delays. Our number is [phone]. MyChart is not constantly monitored and due to the large volume of messages a day, replies may take up to 72 business hours.  MyChart Policy: MyChart allows for you to see your visit notes, after visit summary, provider recommendations, lab and tests results, make an appointment, request refills, and contact your provider or the office for non-urgent questions or concerns. Providers are seeing patients during normal business hours and do not have built in time to review MyChart messages.  We ask that you allow a minimum of 3 business days for responses to [employer]. For this reason, please do not send urgent requests through [place]. Please call the office at [phone]. New and ongoing conditions may require a visit. We have virtual and in person visit available for your convenience.  Complex MyChart concerns may require a visit. Your provider may request you schedule a virtual or in person visit to ensure we are providing the best care possible. MyChart messages  sent after 11:00 AM on Friday will not be received by the provider until Monday morning.    Lab and Test Results: You will receive your lab and test results on MyChart as soon as they are completed and results have been sent by the lab or testing facility. Due to this service, you will receive your results BEFORE your provider.  I review lab and tests results each morning prior to seeing patients. Some  results require collaboration with other providers to ensure you are receiving the most appropriate care. For this reason, we ask that you please allow a minimum of 3-5 business days from the time the ALL results have been received for your provider to receive and review lab and test results and contact you about these.  Most lab and test result comments from the provider will be sent through [place]. Your provider may recommend changes to the plan of care, follow-up visits, repeat testing, ask questions, or request an office visit to discuss these results. You may reply directly to this message or call the office at [phone] to provide information for the provider or set up an appointment. In some instances, you will be called with test results and recommendations. Please let us know if this is preferred and we will make note of this in your chart to provide this for you.    If you have not heard a response to your lab or test results in 5 business days from all results returning to [place], please call the office to let us know. We ask that you please avoid calling prior to this time unless there is an emergent concern. Due to high call volumes, this can delay the resulting process.  After Hours: For all non-emergency after hours needs, please call the office at [phone] and select the option to reach the on-call provider service. On-call services are shared between multiple [place] offices and therefore it will not be possible to speak directly with your provider. On-call providers may provide medical advice and recommendations, but are unable to provide refills for maintenance medications.  For all emergency or urgent medical needs after normal business hours, we recommend that you seek care at the closest Urgent Care or Emergency Department to ensure appropriate treatment in a timely manner.  [facility] [place] has a 24 hour emergency room located on the ground floor for your  convenience.   Urgent Concerns During the Business Day Providers are seeing patients from 8AM to [place] with a busy schedule and are most often not able to respond to non-urgent calls until the end of the day or the next business day. If you should have URGENT concerns during the day, please call and speak to the nurse or schedule a same day appointment so that we can address your concern without delay.   Thank you, again, for choosing me as your health care partner. I appreciate your trust and look forward to learning more about you.   Worthy Keeler, DNP, AGNP-c ___________________________________________________________  Health Maintenance Recommendations Screening Testing Mammogram Every 1 -2 years based on history and risk factors Starting at age 83 Pap Smear Ages 21-39 every 3 years Ages 29-65 every 5 years with HPV testing More frequent testing may be required based on results and history Colon Cancer Screening Every 1-10 years based on test performed, risk factors, and history Starting at age 3 Bone Density Screening Every 2-10 years based on history Starting at age 53 for women Recommendations for men differ  based on medication usage, history, and risk factors AAA Screening One time ultrasound Men 45-11 years old who have every smoked Lung Cancer Screening Low Dose Lung CT every 12 months Age 41-80 years with a 30 pack-year smoking history who still smoke or who have quit within the last 15 years  Screening Labs Routine  Labs: Complete Blood Count (CBC), Complete Metabolic Panel (CMP), Cholesterol (Lipid Panel) Every 6-12 months based on history and medications May be recommended more frequently based on current conditions or previous results Hemoglobin A1c Lab Every 3-12 months based on history and previous results Starting at age 73 or earlier with diagnosis of diabetes, high cholesterol, BMI >26, and/or risk factors Frequent monitoring for patients with diabetes to  ensure blood sugar control Thyroid Panel (TSH w/ T3 & T4) Every 6 months based on history, symptoms, and risk factors May be repeated more often if on medication HIV One time testing for all patients 89 and older May be repeated more frequently for patients with increased risk factors or exposure Hepatitis C One time testing for all patients 37 and older May be repeated more frequently for patients with increased risk factors or exposure Gonorrhea, Chlamydia Every 12 months for all sexually active persons 13-24 years Additional monitoring may be recommended for those who are considered high risk or who have symptoms PSA Men 47-72 years old with risk factors Additional screening may be recommended from age 21-69 based on risk factors, symptoms, and history  Vaccine Recommendations Tetanus Booster All adults every 10 years Flu Vaccine All patients 6 months and older every year COVID Vaccine All patients 12 years and older Initial dosing with booster May recommend additional booster based on age and health history HPV Vaccine 2 doses all patients age 8-26 Dosing may be considered for patients over 26 Shingles Vaccine (Shingrix) 2 doses all adults 57 years and older Pneumonia (Pneumovax 23) All adults 37 years and older May recommend earlier dosing based on health history Pneumonia (Prevnar 56) All adults 41 years and older Dosed 1 year after Pneumovax 23  Additional Screening, Testing, and Vaccinations may be recommended on an individualized basis based on family history, health history, risk factors, and/or exposure.  __________________________________________________________  Diet Recommendations for All Patients  I recommend that all patients maintain a diet low in saturated fats, carbohydrates, and cholesterol. While this can be challenging at first, it is not impossible and small changes can make big differences.  Things to try: Decreasing the amount of soda, sweet tea,  and/or juice to one or less per day and replace with water While water is always the first choice, if you do not like water you may consider adding a water additive without sugar to improve the taste other sugar free drinks Replace potatoes with a brightly colored vegetable at dinner Use healthy oils, such as canola oil or olive oil, instead of butter or hard margarine Limit your bread intake to two pieces or less a day Replace regular pasta with low carb pasta options Bake, broil, or grill foods instead of frying Monitor portion sizes  Eat smaller, more frequent meals throughout the day instead of large meals  An important thing to remember is, if you love foods that are not great for your health, you don't have to give them up completely. Instead, allow these foods to be a reward when you have done well. Allowing yourself to still have special treats every once in a while is a nice way to tell yourself thank  you for working hard to keep yourself healthy.   Also remember that every day is a new day. If you have a bad day and "fall off the wagon", you can still climb right back up and keep moving along on your journey!  We have resources available to help you!  Some websites that may be helpful include: www.http://carter.biz/  Www.[employer]Well[employer].com _____________________________________________________________  Activity Recommendations for All Patients  I recommend that all adults get at least 20 minutes of moderate physical activity that elevates your heart rate at least 5 days out of the week.  Some examples include: Walking or jogging at a pace that allows you to carry on a conversation Cycling (stationary bike or outdoors) Water aerobics Yoga Weight lifting Dancing If physical limitations prevent you from putting stress on your joints, exercise in a pool or seated in a chair are excellent options.  Do determine your MAXIMUM heart rate for activity: YOUR AGE - 220 = MAX HeartRate    Remember! Do not push yourself too hard.  Start slowly and build up your pace, speed, weight, time in exercise, etc.  Allow your body to rest between exercise and get good sleep. You will need more water than normal when you are exerting yourself. Do not wait until you are thirsty to drink. Drink with a purpose of getting in at least 8, 8 ounce glasses of water a day plus more depending on how much you exercise and sweat.    If you begin to develop dizziness, chest pain, abdominal pain, jaw pain, shortness of breath, headache, vision changes, lightheadedness, or other concerning symptoms, stop the activity and allow your body to rest. If your symptoms are severe, seek emergency evaluation immediately. If your symptoms are concerning, but not severe, please let us know so that we can recommend further evaluation.

## 2021-07-31 NOTE — Assessment & Plan Note (Signed)
NSR today. Most recent episode appx a month ago per patient and he was able to self convert.  Followed by cardiology routinely.  Medications reviewed. No alarm symptoms present.  Continue current regimen.  Will monitor.

## 2021-07-31 NOTE — Assessment & Plan Note (Signed)
Depression and anxiety screen positive today.  Patient declines offer of counseling and medication options today as he does not feel these have been helpful in the past.  Patient has had significant life stressors that can contribute to mood alteration over the past 5 years and it is understandable that these circumstances could trigger these feelings.  I encourage him to consider medication or counseling, even if short term, to see if this is helpful for some of the symptoms he is experiencing.

## 2021-07-31 NOTE — Assessment & Plan Note (Signed)
Review of current and past medical history, social history, medication, and family history.  Review of care gaps and health maintenance recommendations.  Records from recent providers to be requested if not available in Chart Review or Care Everywhere.  Recommendations for health maintenance, diet, and exercise provided.  Labs today: None HM Recommendations: Will review records TBD CPE due: TBD based on record review

## 2021-07-31 NOTE — Assessment & Plan Note (Signed)
Encouraged patient to continue working to increase activity as tolerated to help with weight reduction for overall health benefits.

## 2021-07-31 NOTE — Assessment & Plan Note (Signed)
Difficulty sleeping for quite some time.  Concerns that there is a component associated with depression and anxiety that correlate with this.  I agree with previous providers recommendations to avoid benzodiazepine and Ambien use due to increased risks of altered mental status, falls, and dependence.  Discussed option to try trazodone. Patient is agreeable to try this medication to see if it helps with restful nights sleep.  He will follow-up if this medication is not effective.  We can consider a titration as needed.
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
## 2021-09-28 ENCOUNTER — Ambulatory Visit: Payer: Medicare HMO | Admitting: Podiatry

## 2021-09-28 ENCOUNTER — Other Ambulatory Visit: Payer: Self-pay

## 2021-09-28 DIAGNOSIS — B351 Tinea unguium: Secondary | ICD-10-CM | POA: Diagnosis not present

## 2021-09-28 DIAGNOSIS — M79675 Pain in left toe(s): Secondary | ICD-10-CM | POA: Diagnosis not present

## 2021-09-28 DIAGNOSIS — M79674 Pain in right toe(s): Secondary | ICD-10-CM | POA: Diagnosis not present

## 2021-09-28 DIAGNOSIS — G629 Polyneuropathy, unspecified: Secondary | ICD-10-CM

## 2021-09-28 DIAGNOSIS — I739 Peripheral vascular disease, unspecified: Secondary | ICD-10-CM | POA: Diagnosis not present

## 2021-10-01 NOTE — Progress Notes (Signed)
Subjective:   Patient ID: Sean Arnold, male   DOB: 66 y.o.   MRN: [id number]   HPI 66 year old male presents the office today for concerns of his nails becoming thickened discolored and he cannot trim himself.  He also states that he gets spots on his toes mostly second toe and he applies mupirocin ointment.  He states that he heals very slowly.  He does have neuropathy likely from multiple lumbar surgeries.  He has no other concerns today.  No fevers or chills.   Review of Systems  All other systems reviewed and are negative.  Past Medical History:  Diagnosis Date   A-fib [facility])    Anxiety    Arthritis    Brachial plexopathy 7/86/7672   Left    Complication of anesthesia    Depression    Dysrhythmia    a-Fib    ablation   Hypertension    MGUS (monoclonal gammopathy of unknown significance) 04/02/2019   IgG    Neuromuscular disorder (HCC)    neuropathy  both legs and feet   Peripheral neuropathy 04/02/2019   PONV (postoperative nausea and vomiting)    Severe  nausea   Sleep apnea    CPAP     Past Surgical History:  Procedure Laterality Date   ablation for atrial fib     ACHILLES TENDON REPAIR     arthroscopic knee Bilateral    BACK SURGERY     times four   CERVICAL FUSION     LAMINECTOMY     LAPAROSCOPIC GASTRIC SLEEVE RESECTION     TOTAL KNEE ARTHROPLASTY Left 01/27/2018   Procedure: LEFT TOTAL KNEE ARTHROPLASTY;  Surgeon: Vickey Huger, MD;  Location: [facility];  Service: Orthopedics;  Laterality: Left;     Current Outpatient Medications:    acetaminophen (TYLENOL) 500 MG tablet, Take 250 mg by mouth every 6 (six) hours as needed (for pain.)., Disp: , Rfl:    aspirin EC 325 MG EC tablet, Take 1 tablet (325 mg total) by mouth 2 (two) times daily., Disp: 30 tablet, Rfl: 0   carvedilol (COREG) 12.5 MG tablet, Take 12.5 mg by mouth 2 (two) times daily., Disp: , Rfl:    diclofenac (VOLTAREN) 75 MG EC tablet, Take 75 mg by mouth daily., Disp: , Rfl:    furosemide (LASIX) 20  MG tablet, Take 20 mg by mouth daily., Disp: , Rfl:    gabapentin (NEURONTIN) 300 MG capsule, Take 300 mg by mouth 3 (three) times daily., Disp: , Rfl:    hydrOXYzine (VISTARIL) 25 MG capsule, Take 25 mg by mouth at bedtime as needed., Disp: , Rfl:    lisinopril-hydrochlorothiazide (PRINZIDE,ZESTORETIC) 20-12.5 MG tablet, Take 1 tablet by mouth 2 (two) times daily., Disp: , Rfl:    Magnesium Gluconate 550 MG TABS, , Disp: , Rfl:    Methylnaltrexone Bromide 150 MG TABS, Take 150 mg by mouth as needed. , Disp: , Rfl:    potassium chloride (KLOR-CON) 10 MEQ tablet, Take by mouth., Disp: , Rfl:    rosuvastatin (CRESTOR) 40 MG tablet, Take 40 mg by mouth daily., Disp: , Rfl:    SODIUM FLUORIDE 5000 PPM 1.1 % PSTE, SMARTSIG:Sparingly By Mouth Daily, Disp: , Rfl:    traZODone (DESYREL) 100 MG tablet, Take 1 tablet (100 mg total) by mouth at bedtime., Disp: 90 tablet, Rfl: 1  Allergies  Allergen Reactions   Dofetilide Shortness Of Breath   Morphine And Related Anaphylaxis and Shortness Of Breath    Breathing Distress (patient  states it was only because of large dose)   Codeine Other (See Comments)    headache   Haemophilus Influenzae Vaccines Other (See Comments)    Severe reaction to both arms   Influenza Vac Split Quad Other (See Comments)    Severe reaction to both arms          Objective:  Physical Exam  General: AAO x3, NAD  Dermatological: Small scab present on the left second toe there is no drainage or pus noted today.  No edema.  No fluctuation crepitation there is no malodor.  Nails are hypertrophic, dystrophic with yellow-brown discoloration.  Tenderness nails 1-5 bilaterally.  Vascular: Dorsalis Pedis artery and Posterior Tibial artery pedal pulses are decreased bilateral with immedate capillary fill time.  There is no pain with calf compression.  There is dry skin present to the legs and mild erythema of both legs which appear to be chronic and more venous  insufficiency.  Neruologic: Sensation absent Semmes-Weinstein monofilament  Musculoskeletal: Muscular strength 5/5 in all groups tested bilateral.  Gait: Unassisted, Nonantalgic.       Assessment:   66 year old male with symptomatic onychomycosis, significant neuropathy, concern for PAD     Plan:  -Treatment options discussed including all alternatives, risks, and complications -Etiology of symptoms were discussed -Sharply debrided the nails x10 without any complications or bleeding. -ABI ordered -Continue mupirocin ointment for the left second toe.  Monitoring signs or symptoms of infection.  If not healing next couple weeks to let me know or sooner if there is any worsening or changes. -Daily foot inspection.    Trula Slade DPM

## 2021-10-03 ENCOUNTER — Other Ambulatory Visit: Payer: Self-pay

## 2021-10-03 ENCOUNTER — Ambulatory Visit (HOSPITAL_COMMUNITY)
Admission: RE | Admit: 2021-10-03 | Discharge: 2021-10-03 | Disposition: A | Discharge status: Home / Self Care | Payer: Medicare HMO | Source: Ambulatory Visit | Attending: Podiatry | Admitting: Podiatry

## 2021-10-03 DIAGNOSIS — I739 Peripheral vascular disease, unspecified: Secondary | ICD-10-CM | POA: Diagnosis not present

## 2022-01-02 ENCOUNTER — Ambulatory Visit: Payer: Medicare HMO | Admitting: Podiatry

## 2022-01-02 DIAGNOSIS — M79674 Pain in right toe(s): Secondary | ICD-10-CM | POA: Diagnosis not present

## 2022-01-02 DIAGNOSIS — B351 Tinea unguium: Secondary | ICD-10-CM | POA: Diagnosis not present

## 2022-01-02 DIAGNOSIS — L309 Dermatitis, unspecified: Secondary | ICD-10-CM

## 2022-01-02 DIAGNOSIS — M79675 Pain in left toe(s): Secondary | ICD-10-CM

## 2022-01-02 MED ORDER — TRIAMCINOLONE ACETONIDE 0.025 % EX OINT: 1.0000 "application " | TOPICAL_OINTMENT | Freq: Two times a day (BID) | CUTANEOUS | 0 refills | Status: DC

## 2022-01-11 NOTE — Progress Notes (Addendum)
Subjective: ?66 year old male presents the office today for thick, elongated toenails that he is unable to trim himself.  Denies any swelling redness or any drainage.  Also has concerns of skin rash on his legs and his feet.  He is asking if I can treat this or if he needs to be seen by dermatologist.  No open lesions. ? ?Objective: ?AAO x3, NAD ?DP/PT pulses palpable bilaterally, CRT less than 3 seconds ?Nails are hypertrophic, dystrophic, brittle, discolored, elongated ?10. No surrounding redness or drainage. Tenderness nails 1-5 bilaterally. No open lesions or pre-ulcerative lesions are identified today. ?Dry, scaly, erythematous skin bilaterally from the feet to the legs there is no open sores or excoriations.  There is no drainage or pus. ?No pain with calf compression, swelling, warmth, erythema ? ?Assessment: ?Symptomatic onychomycosis, dermatitis ? ?Plan: ?-All treatment options discussed with the patient including all alternatives, risks, complications.  ?-Sharply debrided the nails x10 without any complications or bleeding. ?-I have started him on triamcinolone 0.025%.  Referral to dermatology for this but he also has a skin tag on his face.  ?-Arterial studies reviewed.  ?-Patient encouraged to call the office with any questions, concerns, change in symptoms.  ? ?*Patient was upset about the delay today.  I apologized and I gave this information to our office manager to contact him. ? ?Trula Slade DPM ? ? ?

## 2022-02-01 ENCOUNTER — Other Ambulatory Visit (HOSPITAL_BASED_OUTPATIENT_CLINIC_OR_DEPARTMENT_OTHER): Payer: Self-pay | Admitting: Nurse Practitioner

## 2022-02-01 DIAGNOSIS — I10 Essential (primary) hypertension: Secondary | ICD-10-CM

## 2022-02-01 DIAGNOSIS — M1990 Unspecified osteoarthritis, unspecified site: Secondary | ICD-10-CM

## 2022-10-04 DIAGNOSIS — K5909 Other constipation: Secondary | ICD-10-CM | POA: Diagnosis not present

## 2022-10-04 DIAGNOSIS — Z6839 Body mass index (BMI) 39.0-39.9, adult: Secondary | ICD-10-CM | POA: Diagnosis not present

## 2022-10-04 DIAGNOSIS — I4891 Unspecified atrial fibrillation: Secondary | ICD-10-CM | POA: Diagnosis not present

## 2022-10-04 DIAGNOSIS — Z8639 Personal history of other endocrine, nutritional and metabolic disease: Secondary | ICD-10-CM | POA: Diagnosis not present

## 2022-10-04 DIAGNOSIS — R1012 Left upper quadrant pain: Secondary | ICD-10-CM | POA: Diagnosis not present

## 2022-10-04 DIAGNOSIS — D472 Monoclonal gammopathy: Secondary | ICD-10-CM | POA: Diagnosis not present

## 2022-10-04 DIAGNOSIS — R109 Unspecified abdominal pain: Secondary | ICD-10-CM | POA: Diagnosis not present

## 2022-10-04 DIAGNOSIS — G4733 Obstructive sleep apnea (adult) (pediatric): Secondary | ICD-10-CM | POA: Diagnosis not present

## 2022-10-04 DIAGNOSIS — R638 Other symptoms and signs concerning food and fluid intake: Secondary | ICD-10-CM | POA: Diagnosis not present

## 2022-10-04 DIAGNOSIS — Z125 Encounter for screening for malignant neoplasm of prostate: Secondary | ICD-10-CM | POA: Diagnosis not present

## 2022-10-04 DIAGNOSIS — Z0000 Encounter for general adult medical examination without abnormal findings: Secondary | ICD-10-CM | POA: Diagnosis not present

## 2022-10-04 DIAGNOSIS — K5900 Constipation, unspecified: Secondary | ICD-10-CM | POA: Diagnosis not present

## 2022-10-04 DIAGNOSIS — Z1212 Encounter for screening for malignant neoplasm of rectum: Secondary | ICD-10-CM | POA: Diagnosis not present

## 2022-10-04 DIAGNOSIS — M4800 Spinal stenosis, site unspecified: Secondary | ICD-10-CM | POA: Diagnosis not present

## 2022-10-04 DIAGNOSIS — E669 Obesity, unspecified: Secondary | ICD-10-CM | POA: Diagnosis not present

## 2022-10-04 DIAGNOSIS — I10 Essential (primary) hypertension: Secondary | ICD-10-CM | POA: Diagnosis not present

## 2022-10-04 DIAGNOSIS — Z1211 Encounter for screening for malignant neoplasm of colon: Secondary | ICD-10-CM | POA: Diagnosis not present

## 2022-10-05 ENCOUNTER — Other Ambulatory Visit: Payer: Self-pay | Admitting: Infectious Diseases

## 2022-10-05 DIAGNOSIS — R7989 Other specified abnormal findings of blood chemistry: Secondary | ICD-10-CM

## 2022-10-09 ENCOUNTER — Telehealth: Payer: Self-pay | Admitting: Hematology

## 2022-10-09 NOTE — Telephone Encounter (Signed)
Scheduled appt per 1/30 referral. Pt is aware of appt date and time. Pt is aware to arrive 15 mins prior to appt time and to bring and updated insurance card. Pt is aware of appt location.

## 2022-10-15 ENCOUNTER — Ambulatory Visit
Admission: RE | Admit: 2022-10-15 | Discharge: 2022-10-15 | Disposition: A | Discharge status: Home / Self Care | Payer: Medicare HMO | Source: Ambulatory Visit | Attending: Infectious Diseases | Admitting: Infectious Diseases

## 2022-10-15 DIAGNOSIS — R7989 Other specified abnormal findings of blood chemistry: Secondary | ICD-10-CM | POA: Diagnosis not present

## 2022-10-15 DIAGNOSIS — K8020 Calculus of gallbladder without cholecystitis without obstruction: Secondary | ICD-10-CM | POA: Diagnosis not present

## 2022-10-15 DIAGNOSIS — K760 Fatty (change of) liver, not elsewhere classified: Secondary | ICD-10-CM | POA: Diagnosis not present

## 2022-10-15 DIAGNOSIS — R945 Abnormal results of liver function studies: Secondary | ICD-10-CM | POA: Diagnosis not present

## 2022-11-02 ENCOUNTER — Inpatient Hospital Stay: Payer: Medicare HMO | Attending: Hematology | Admitting: Hematology

## 2022-11-02 ENCOUNTER — Inpatient Hospital Stay: Payer: Medicare HMO

## 2022-11-02 VITALS — BP 140/88 | HR 75 | Temp 97.5°F | Resp 20 | Wt 303.9 lb

## 2022-11-02 DIAGNOSIS — D472 Monoclonal gammopathy: Secondary | ICD-10-CM | POA: Diagnosis not present

## 2022-11-02 DIAGNOSIS — G629 Polyneuropathy, unspecified: Secondary | ICD-10-CM | POA: Diagnosis not present

## 2022-11-02 DIAGNOSIS — R5383 Other fatigue: Secondary | ICD-10-CM

## 2022-11-02 DIAGNOSIS — I480 Paroxysmal atrial fibrillation: Secondary | ICD-10-CM | POA: Insufficient documentation

## 2022-11-02 LAB — CMP (CANCER CENTER ONLY)
ALT: 82 U/L — ABNORMAL HIGH (ref 0–44)
AST: 87 U/L — ABNORMAL HIGH (ref 15–41)
Albumin: 4 g/dL (ref 3.5–5.0)
Alkaline Phosphatase: 161 U/L — ABNORMAL HIGH (ref 38–126)
Anion gap: 9 (ref 5–15)
BUN: 15 mg/dL (ref 8–23)
CO2: 27 mmol/L (ref 22–32)
Calcium: 9 mg/dL (ref 8.9–10.3)
Chloride: 101 mmol/L (ref 98–111)
Creatinine: 0.82 mg/dL (ref 0.61–1.24)
GFR, Estimated: >60 mL/min (ref >60)
Glucose, Bld: 90 mg/dL (ref 70–99)
Potassium: 3.6 mmol/L (ref 3.5–5.1)
Sodium: 137 mmol/L (ref 135–145)
Total Bilirubin: 1.3 mg/dL — ABNORMAL HIGH (ref 0.3–1.2)
Total Protein: 8 g/dL (ref 6.5–8.1)

## 2022-11-02 LAB — CBC WITH DIFFERENTIAL (CANCER CENTER ONLY)
Abs Immature Granulocytes: 0.02 10*3/uL (ref 0.00–0.07)
Basophils Absolute: 0 10*3/uL (ref 0.0–0.1)
Basophils Relative: 0 %
Eosinophils Absolute: 0.4 10*3/uL (ref 0.0–0.5)
Eosinophils Relative: 5 %
HCT: 44.3 % (ref 39.0–52.0)
Hemoglobin: 15.8 g/dL (ref 13.0–17.0)
Immature Granulocytes: 0 %
Lymphocytes Relative: 23 %
Lymphs Abs: 1.9 10*3/uL (ref 0.7–4.0)
MCH: 33.1 pg (ref 26.0–34.0)
MCHC: 35.7 g/dL (ref 30.0–36.0)
MCV: 92.7 fL (ref 80.0–100.0)
Monocytes Absolute: 0.6 10*3/uL (ref 0.1–1.0)
Monocytes Relative: 8 %
Neutro Abs: 5.2 10*3/uL (ref 1.7–7.7)
Neutrophils Relative %: 64 %
Platelet Count: 270 10*3/uL (ref 150–400)
RBC: 4.78 MIL/uL (ref 4.22–5.81)
RDW: 12.2 % (ref 11.5–15.5)
WBC Count: 8.2 10*3/uL (ref 4.0–10.5)
nRBC: 0 % (ref 0.0–0.2)

## 2022-11-02 LAB — FERRITIN: Ferritin: 173 ng/mL (ref 24–336)

## 2022-11-02 LAB — IRON AND IRON BINDING CAPACITY (CC-WL,HP ONLY)
Iron: 203 ug/dL — ABNORMAL HIGH (ref 45–182)
Saturation Ratios: 72 % — ABNORMAL HIGH (ref 17.9–39.5)
TIBC: 283 ug/dL (ref 250–450)
UIBC: 80 ug/dL — ABNORMAL LOW (ref 117–376)

## 2022-11-02 LAB — SEDIMENTATION RATE: Sed Rate: 42 mm/hr — ABNORMAL HIGH (ref 0–16)

## 2022-11-02 NOTE — Progress Notes (Signed)
HEMATOLOGY/ONCOLOGY CLINIC NOTE  Date of Service: 11/02/2022  Patient Care Team: Ebbie Ridge, MD as PCP - General (Cardiology) Brunetta Genera, MD as Consulting Physician (Hematology)  CHIEF COMPLAINTS/PURPOSE OF CONSULTATION:  Monoclonal Paraproteinemia  HISTORY OF PRESENTING ILLNESS:   Sean Arnold is a wonderful 67 y.o. male who has been referred to [place] by Dr. 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 The pt reports that he is doing well overall.   The pt reports that he began having left sided foot drop in the mid 1990s which was progressive, which advanced to below the left knee. In early 2000 he noticed the same symptom in his right leg. He notes many injuries to the lumbar spine which he associates with a history of athletics. He previously rode 8000 miles a year, but not recently. He had a 4 level laminectomy in the past and is considering a complete fusion. He is scheduled for an MRI total spine in the coming weeks.  The pt also endorses a history of paroxysmal Afib. He has had 26 cardioversions. Has had a left knee replacement which went well. He had cervical fusion with C5-C6 followed by demolition. He is anticipating further cervical spine surgery and endorses stenosis with impingement.  The pt notes that he does stumble and fall. He is able to mow his lawn, but needs to pause between each row to take a seat. The pt notes that he does not sleep well due to pain with various positions. He endorses frequent napping. He was placed on opioid therapy two years ago. He has also tried Neurontin without relief. He has used [place] which hasn't been helpful for him. He is on a '5mg'$  Prednisone daily.  The pt had a "severe reaction" to the flu shot in both 2019 and 2018. 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 He had a weakness, and pain in his right shoulder at the site of the injection.  He has been advised to forego subsequent annual flu vaccines. He endorses persisting weakness in his left arm. His upper extremity symptoms are new since 2018.  The pt notes that he has had a Ferritin level in the 400s, about 20 years ago. He pursued therapeutic phlebotomies and began feeling badly as he became anemic. 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  Most recent lab results 01/28/19 MMP revealed all values WNL except for IgA at 699, B-globulin at 1.4, and M Protein at 0.4g with IgG Lambda specificity 01/28/19 Sed Rate at 41 07/31/18 CBC revealed all values WNL except for WBC at 12.4k  On review of systems, pt reports good energy levels, weight gain, and denies concerns for infections, new back pains, and any other symptoms.   On PMHx the pt reports 20 year history of b/l foot drops, peripheral neuropathy, brachial plexopathy, gastro vertical sleeve. On Social Hx the pt reports working as a Education officer, museum, previously worked for [employer]. On Family Hx the pt denies cancer.  Interval History  Sean Arnold is a 67 y.o. 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 I last connected with the patient on 04/07/2019 and  he reported a scheduled colonoscopy due to a positive Cologuard test. He noted chronic constipation, neuropathy, and a hx  of proctitis. He complained of continued weakness in his left UE caused by a reaction to the influenza vaccination and was diagnosed with Parsonage-Turner syndrome.  Today, he reports that his neuropathy has been slowly progressing. He describes that the symptoms began in his left foot, then radiated upwards. The sensation also developed in his right LE and he complains of almost no sensation. He explains that his lower leg neuropathy has been unaffected by his previous  medications or surgeries. He was last seen by a neurologist prior to his last visit with me. He reports that his ferritin levels have not been checked last year.  He complains of restless leg syndrome that radiates to entire body. He reports that Gabapentin, even at an increased dose, did not improve symptoms and left him with an "oozy" sensation. He has not previously tried Lyrica. He notes that his restless leg syndrome causes frequent sleep disturbances every night. He previously started Oxycodone December 2022 which improved his sleeping habits, but he discontinued it March, 2022. He compliantly uses a CPAP machine every night.  He complains of frequent fatigue that has been bothersome. He reports that he has increased his walking with a cane, though this has lead to increased fatigue. He notes that he has previously had 5 back operations.   In regards to his diet, he does generally limit his consumption of sweets, but does drink sweet tea regularly. He was previously a significant blood donor, but he had discontinued due to decreased ferritin levels of 35. He reports feeling better after his phlebotomies had been discontinued and he has not donated in the last 2-4 years.   He previously had a 6 mm gallstone removed and reports that his Mother has also had a hx of gallstone removal. He denies any change in bowel habits or abdominal pain. He reports that he has regularly stayed active growing up and engaged in cycling frequently, cycling approximately 8000 miles a year prior to his back issues. He notes that he cannot lift his head too high, but has a normal ROM otherwise. He reports that he previously had an adverse reaction to his influenza vaccine.    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    Neuromuscular disorder (HCC)    neuropathy  both legs and feet   Peripheral neuropathy 04/02/2019   PONV (postoperative nausea and vomiting)    Severe  nausea   Sleep apnea    CPAP     SURGICAL HISTORY: Past Surgical History:  Procedure Laterality Date   ablation for atrial fib     ACHILLES TENDON REPAIR     arthroscopic knee Bilateral    BACK SURGERY     times four   CERVICAL FUSION     LAMINECTOMY     LAPAROSCOPIC GASTRIC SLEEVE RESECTION     TOTAL KNEE ARTHROPLASTY Left 01/27/2018   Procedure: LEFT TOTAL KNEE ARTHROPLASTY;  Surgeon: Vickey Huger, MD;  Location: [place];  Service: Orthopedics;  Laterality: Left;    SOCIAL HISTORY: Social History   Socioeconomic History   Marital status: Divorced    Spouse name: Not on file   Number  of children: 2   Years of education: Not on file   Highest education level: Not on file  Occupational History   Not on file  Tobacco Use   Smoking status: Some Days    Years: 40.00    Types: Cigarettes   Smokeless tobacco: Never  Vaping Use   Vaping Use: Never used  Substance and Sexual Activity   Alcohol use: Yes    Comment: rarely   Drug use: Never   Sexual activity: Not on file  Other Topics Concern   Not on file  Social History Narrative   Right handed    Caffeine 5-6 cups daily   Lives at home with dog max    Social Determinants of Health   Financial Resource Strain: Not on file  Food Insecurity: Not on file  Transportation Needs: Not on file  Physical Activity: Not on file  Stress: Not on file  Social Connections: Not on file  Intimate Partner Violence: Not on file    FAMILY HISTORY: Family History  Problem Relation Age of Onset   GI problems Mother    Arthritis Mother    Depression Mother    Pneumonia Father    Atrial fibrillation Father    Alcohol abuse Father    Arthritis Father    Depression Father    Depression Sister    Arthritis  Sister    Arthritis Brother    Depression Brother    Hyperlipidemia Brother    Hypertension Brother    Colon cancer Maternal Uncle    Esophageal cancer Neg Hx    Stomach cancer Neg Hx    Rectal cancer Neg Hx     ALLERGIES:  is allergic to dofetilide, morphine and related, codeine, haemophilus influenzae vaccines, and influenza vac split quad.  MEDICATIONS:  Current Outpatient Medications  Medication Sig Dispense Refill   aspirin EC 325 MG EC tablet Take 1 tablet (325 mg total) by mouth 2 (two) times daily. 30 tablet 0   diclofenac (VOLTAREN) 75 MG EC tablet TAKE 1 TABLET BY MOUTH TWICE A DAY 180 tablet 3   lisinopril-hydrochlorothiazide (ZESTORETIC) 20-12.5 MG tablet TAKE 2 TABLETS BY MOUTH DAILY 180 tablet 3   Magnesium Gluconate 550 MG TABS      potassium chloride (KLOR-CON) 10 MEQ tablet Take by mouth.     SODIUM FLUORIDE 5000 PPM 1.1 % PSTE SMARTSIG:Sparingly By Mouth Daily     triamcinolone (KENALOG) 0.025 % ointment Apply 1 application. topically 2 (two) times daily. 30 g 0   acetaminophen (TYLENOL) 500 MG tablet Take 250 mg by mouth every 6 (six) hours as needed (for pain.).     carvedilol (COREG) 12.5 MG tablet Take 12.5 mg by mouth 2 (two) times daily. (Patient not taking: Reported on 11/02/2022)     furosemide (LASIX) 20 MG tablet Take 20 mg by mouth daily. (Patient not taking: Reported on 11/02/2022)     gabapentin (NEURONTIN) 300 MG capsule Take 300 mg by mouth 3 (three) times daily.     hydrOXYzine (VISTARIL) 25 MG capsule Take 25 mg by mouth at bedtime as needed.     Methylnaltrexone Bromide 150 MG TABS Take 150 mg by mouth as needed.      rosuvastatin (CRESTOR) 40 MG tablet Take 40 mg by mouth daily.     traZODone (DESYREL) 100 MG tablet Take 1 tablet (100 mg total) by mouth at bedtime. 90 tablet 1   No current facility-administered medications for this visit.    REVIEW OF  SYSTEMS:    10 Point review of Systems was done is negative except as noted above.   PHYSICAL  EXAMINATION:  Vitals:   11/02/22 1412  BP: (!) 140/88  Pulse: 75  Resp: 20  Temp: (!) 97.5 F (36.4 C)  SpO2: 98%   Filed Weights   11/02/22 1412  Weight: (!) 303 lb 14.4 oz (137.8 kg)   Body mass index is 39.02 kg/m.   GENERAL:alert, in no acute distress and comfortable SKIN: no acute rashes, no significant lesions EYES: conjunctiva are pink and non-injected, sclera anicteric OROPHARYNX: MMM, no exudates, no oropharyngeal erythema or ulceration NECK: supple, no JVD LYMPH:  no palpable lymphadenopathy in the cervical, axillary or inguinal regions LUNGS: clear to auscultation b/l with normal respiratory effort HEART: regular rate & rhythm ABDOMEN:  normoactive bowel sounds , non tender, not distended. Extremity: no pedal edema PSYCH: alert & oriented x 3 with fluent speech NEURO: no focal motor/sensory deficits   LABORATORY DATA:  I have reviewed the data as listed  03/09/2019 LABS Component     Latest Ref Rng & Units 03/09/2019  WBC     4.0 - 10.5 K/uL 8.3  RBC     4.22 - 5.81 MIL/uL 4.67  Hemoglobin     13.0 - 17.0 g/dL 14.6  HCT     39.0 - 52.0 % 42.2  MCV     80.0 - 100.0 fL 90.4  MCH     26.0 - 34.0 pg 31.3  MCHC     30.0 - 36.0 g/dL 34.6  RDW     11.5 - 15.5 % 12.2  Platelets     150 - 400 K/uL 220  nRBC     0.0 - 0.2 % 0.0  Neutrophils     % 73  NEUT#     1.7 - 7.7 K/uL 6.2  Lymphocytes     % 16  Lymphocyte #     0.7 - 4.0 K/uL 1.3  Monocytes Relative     % 5  Monocyte #     0.1 - 1.0 K/uL 0.4  Eosinophil     % 4  Eosinophils Absolute     0.0 - 0.5 K/uL 0.3  Basophil     % 1  Basophils Absolute     0.0 - 0.1 K/uL 0.0  Immature Granulocytes     % 1  Abs Immature Granulocytes     0.00 - 0.07 K/uL 0.04  Sodium     135 - 145 mmol/L 136  Potassium     3.5 - 5.1 mmol/L 3.9  Chloride     98 - 111 mmol/L 102  CO2     22 - 32 mmol/L 26  Glucose     70 - 99 mg/dL 97  BUN     8 - 23 mg/dL 15  Creatinine     0.61 - 1.24 mg/dL  0.91  Calcium     8.9 - 10.3 mg/dL 8.7 (L)  Total Protein     6.5 - 8.1 g/dL 7.7  Albumin     3.5 - 5.0 g/dL 3.8  AST     15 - 41 U/L 27  ALT     0 - 44 U/L 22  Alkaline Phosphatase     38 - 126 U/L 54  Total Bilirubin     0.3 - 1.2 mg/dL 0.5  GFR, Est Non African American     >60 mL/min >60  GFR, Est African American     >  60 mL/min >60  Anion gap     5 - 15 8  IgG (Immunoglobin G), Serum     603 - 1,613 mg/dL 1,184  IgA     61 - 437 mg/dL 625 (H)  IgM (Immunoglobulin M), Srm     20 - 172 mg/dL 47  Total Protein ELP     6.0 - 8.5 g/dL 7.1  Albumin SerPl Elph-Mcnc     2.9 - 4.4 g/dL 3.8  Alpha 1     0.0 - 0.4 g/dL 0.2  Alpha2 Glob SerPl Elph-Mcnc     0.4 - 1.0 g/dL 0.9  B-Globulin SerPl Elph-Mcnc     0.7 - 1.3 g/dL 1.1  Gamma Glob SerPl Elph-Mcnc     0.4 - 1.8 g/dL 1.1  M Protein SerPl Elph-Mcnc     Not Observed g/dL 0.4 (H)  Globulin, Total     2.2 - 3.9 g/dL 3.3  Albumin/Glob SerPl     0.7 - 1.7 1.2  IFE 1      Comment  Please Note (HCV):      Comment  Iron     42 - 163 ug/dL 89  TIBC     202 - 409 ug/dL 271  Saturation Ratios     20 - 55 % 33  UIBC     117 - 376 ug/dL 183  Kappa free light chain     3.3 - 19.4 mg/L 23.8 (H)  Lamda free light chains     5.7 - 26.3 mg/L 19.0  Kappa, lamda light chain ratio     0.26 - 1.65 1.25  Beta-2 Microglobulin     0.6 - 2.4 mg/L 1.8  Ferritin     24 - 336 ng/mL 72  DNA Mutation Analysis      Comment  Sed Rate     0 - 16 mm/hr 14    .    Latest Ref Rng & Units 10/03/2020    4:31 PM 03/09/2019    2:41 PM 07/31/2018   12:59 AM  CBC  WBC 4.0 - 10.5 K/uL 7.9  8.3  12.4   Hemoglobin 13.0 - 17.0 g/dL 15.0  14.6  15.4   Hematocrit 39.0 - 52.0 % 43.5  42.2  45.9   Platelets 150.0 - 400.0 K/uL 308.0  220  258     .    Latest Ref Rng & Units 10/03/2020    4:31 PM 03/09/2019    2:41 PM 01/28/2019    3:10 PM  CMP  Glucose 70 - 99 mg/dL 76  97    BUN 6 - 23 mg/dL 13  15    Creatinine 0.40 - 1.50 mg/dL  0.81  0.91    Sodium 135 - 145 mEq/L 135  136    Potassium 3.5 - 5.1 mEq/L 3.8  3.9    Chloride 96 - 112 mEq/L 101  102    CO2 19 - 32 mEq/L 28  26    Calcium 8.4 - 10.5 mg/dL 9.3  8.7    Total Protein 6.0 - 8.3 g/dL 8.2  7.7  7.3   Total Bilirubin 0.2 - 1.2 mg/dL 0.5  0.5    Alkaline Phos 39 - 117 U/L 62  54    AST 0 - 37 U/L 23  27    ALT 0 - 53 U/L 15  22     Hemochromatosis DNA, PCR Order: [id number] Status:  Final result   Visible to patient:  Yes (MyChart)  Next appt:  04/21/2019 at 12:50 PM in Radiology (GI-315 MR 1) Dx:  Monoclonal paraproteinemia; Hemochrom... Component [id number]  DNA Mutation Analysis Comment   Comment: (NOTE)  Result:  AFFECTED  Two copies of the same mutation (C282Y and C282Y) identified         RADIOGRAPHIC STUDIES: I have personally reviewed the radiological images as listed and agreed with the findings in the report. [place]Abdomen Limited RUQ (LIVER/GB)  Result Date: 10/15/2022 CLINICAL DATA:  Elevated LFTs EXAM: ULTRASOUND ABDOMEN LIMITED RIGHT UPPER QUADRANT COMPARISON:  None Available. FINDINGS: Gallbladder: Difficult to visualize. 6 mm stone in the gallbladder lumen. No gallbladder wall thickening or pericholecystic fluid. Negative sonographic Murphy's sign. Common bile duct: Diameter: 3.6 mm Liver: Diffusely increased in echogenicity. No focal lesion. Portal vein is patent on color Doppler imaging with normal direction of blood flow towards the liver. Other: None. IMPRESSION: 1. Cholelithiasis without secondary signs of acute cholecystitis. 2. Hepatic steatosis. Electronically Signed   By: DLovey NewcomerM.D.   On: 10/15/2022 11:33    ASSESSMENT & PLAN:  67y.o. male with  1. 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  PLAN:  -Discussed most recent lab results with patient. -CBC normal, no anemia -CMP normal -thyroid function, vitamin B-12, vitamin D levels normal -ferritin levels have been less than 100 -iron levels not concerning at  this time, will recheck levels today.  -1.7 kappa/lamba ratio, slightly above normal, not concerning at this time -patient reveals that latest myeloma panel revealed M-spike has been stable at 0.4, unchanged in the last 3-4 years, which is reassuring -discussed that m-proteins are generally more stable, whereas kappa/lambda ratio is typically more susceptible to fluctuation due to factors such as chronic kidney disease or dehydration -discussed MGUS may be associated with osteoposis stimulation. Not unreasonable to proceed with bone density scan -2020 colonoscopy did not reveal any concerns for overt cancer. -recommend patient to optimize sleep apnea to improve fatigue -Order low-does Cymbalta to improve neuropathy symptoms. May try Lyrica if there are issues with tolerating Cymbalta  -Discussed that sugar intake may have some association with some cancers in certain circumstances. However, discussed importance of overall diet and other factors that play important role.  -Recommend consumption of a variety of fresh fruits and vegetables as they contain plenty of anti-cancer properties. Recommend to limit preservatives and processed foods. -educated patient that narcotics can be associated with restless leg syndrome -Recommend patient to have testosterone levels checked -Stable from a myeloma standpoint  -Advised patient to FU with PCP to ensure fatigue is not related to issues such as iron overload, myeloma, or vitamin deficiencies  2. 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 Lab Results  Component Value Date   IRON 89 03/09/2019   TIBC 271 03/09/2019   IRONPCTSAT 33 03/09/2019   (Iron and TIBC)  Lab Results  Component Value Date   FERRITIN 72 03/09/2019   -no indication for therapeutic phlebotomy at this time. -Discussed that it is  interesting that ferritin is so low because hemochromatosis usually causes increased ferritin w/o phlebotomies  -will monitor - goal would be to keep ferritin levels under 100.  3. +ve cologard testing -patient has already been scheduled for colonoscopy  FOLLOW-UP: Labs today Phone visit in 1 week  The total time spent in the appointment was *** minutes* .  All of the patient's questions were answered with  apparent satisfaction. The patient knows to call the clinic with any problems, questions or concerns.   Sullivan Lone MD MS AAHIVMS [facility] Hematology/Oncology Physician [facility]  .*Total Encounter Time as defined by the Centers for Medicare and Medicaid Services includes, in addition to the face-to-face time of a patient visit (documented in the note above) non-face-to-face time: obtaining and reviewing outside history, ordering and reviewing medications, tests or procedures, care coordination (communications with other health care professionals or caregivers) and documentation in the medical record.   I,Mitra Faeizi,acting as a Education administrator for Sullivan Lone, MD.,have documented all relevant documentation on the behalf of Sullivan Lone, MD,as directed by  Sullivan Lone, MD while in the presence of Sullivan Lone, MD.  ***

## 2022-11-08 LAB — MULTIPLE MYELOMA PANEL, SERUM
Albumin SerPl Elph-Mcnc: 3.7 g/dL (ref 2.9–4.4)
Albumin/Glob SerPl: 1 (ref 0.7–1.7)
Alpha 1: 0.3 g/dL (ref 0.0–0.4)
Alpha2 Glob SerPl Elph-Mcnc: 0.8 g/dL (ref 0.4–1.0)
B-Globulin SerPl Elph-Mcnc: 1.1 g/dL (ref 0.7–1.3)
Gamma Glob SerPl Elph-Mcnc: 1.6 g/dL (ref 0.4–1.8)
Globulin, Total: 3.8 g/dL (ref 2.2–3.9)
IgA: 764 mg/dL — ABNORMAL HIGH (ref 61–437)
IgG (Immunoglobin G), Serum: 1445 mg/dL (ref 603–1613)
IgM (Immunoglobulin M), Srm: 60 mg/dL (ref 20–172)
M Protein SerPl Elph-Mcnc: 0.4 g/dL — ABNORMAL HIGH
Total Protein ELP: 7.5 g/dL (ref 6.0–8.5)

## 2022-11-08 LAB — TESTOSTERONE, FREE, TOTAL, SHBG
Sex Hormone Binding: 24.9 nmol/L (ref 19.3–76.4)
Testosterone, Free: 2.7 pg/mL — ABNORMAL LOW (ref 6.6–18.1)
Testosterone: 230 ng/dL — ABNORMAL LOW (ref 264–916)

## 2022-11-08 LAB — COPPER, SERUM: Copper: 144 ug/dL — ABNORMAL HIGH (ref 69–132)

## 2022-11-20 DIAGNOSIS — Z6837 Body mass index (BMI) 37.0-37.9, adult: Secondary | ICD-10-CM | POA: Diagnosis not present

## 2022-11-20 DIAGNOSIS — I10 Essential (primary) hypertension: Secondary | ICD-10-CM | POA: Diagnosis not present

## 2022-11-20 DIAGNOSIS — G4733 Obstructive sleep apnea (adult) (pediatric): Secondary | ICD-10-CM | POA: Diagnosis not present

## 2022-11-20 DIAGNOSIS — E6609 Other obesity due to excess calories: Secondary | ICD-10-CM | POA: Diagnosis not present

## 2022-11-20 DIAGNOSIS — I4891 Unspecified atrial fibrillation: Secondary | ICD-10-CM | POA: Diagnosis not present

## 2022-12-12 DIAGNOSIS — R1013 Epigastric pain: Secondary | ICD-10-CM | POA: Diagnosis not present

## 2022-12-12 DIAGNOSIS — K5900 Constipation, unspecified: Secondary | ICD-10-CM | POA: Diagnosis not present

## 2022-12-12 DIAGNOSIS — R634 Abnormal weight loss: Secondary | ICD-10-CM | POA: Diagnosis not present

## 2022-12-14 ENCOUNTER — Other Ambulatory Visit: Payer: Self-pay | Admitting: Gastroenterology

## 2022-12-14 DIAGNOSIS — R634 Abnormal weight loss: Secondary | ICD-10-CM | POA: Diagnosis not present

## 2022-12-14 DIAGNOSIS — R1013 Epigastric pain: Secondary | ICD-10-CM

## 2022-12-14 DIAGNOSIS — R7401 Elevation of levels of liver transaminase levels: Secondary | ICD-10-CM | POA: Diagnosis not present

## 2022-12-14 DIAGNOSIS — K838 Other specified diseases of biliary tract: Secondary | ICD-10-CM | POA: Diagnosis not present

## 2022-12-14 DIAGNOSIS — R112 Nausea with vomiting, unspecified: Secondary | ICD-10-CM | POA: Diagnosis not present

## 2022-12-14 DIAGNOSIS — R1011 Right upper quadrant pain: Secondary | ICD-10-CM | POA: Diagnosis not present

## 2022-12-14 DIAGNOSIS — R7989 Other specified abnormal findings of blood chemistry: Secondary | ICD-10-CM | POA: Diagnosis not present

## 2022-12-14 DIAGNOSIS — Z87891 Personal history of nicotine dependence: Secondary | ICD-10-CM | POA: Diagnosis not present

## 2022-12-15 DIAGNOSIS — K8070 Calculus of gallbladder and bile duct without cholecystitis without obstruction: Secondary | ICD-10-CM | POA: Diagnosis not present

## 2022-12-15 DIAGNOSIS — G4733 Obstructive sleep apnea (adult) (pediatric): Secondary | ICD-10-CM | POA: Diagnosis not present

## 2022-12-15 DIAGNOSIS — Z79899 Other long term (current) drug therapy: Secondary | ICD-10-CM | POA: Diagnosis not present

## 2022-12-15 DIAGNOSIS — Z885 Allergy status to narcotic agent status: Secondary | ICD-10-CM | POA: Diagnosis not present

## 2022-12-15 DIAGNOSIS — K8045 Calculus of bile duct with chronic cholecystitis with obstruction: Secondary | ICD-10-CM | POA: Diagnosis not present

## 2022-12-15 DIAGNOSIS — K8020 Calculus of gallbladder without cholecystitis without obstruction: Secondary | ICD-10-CM | POA: Diagnosis not present

## 2022-12-15 DIAGNOSIS — Z87891 Personal history of nicotine dependence: Secondary | ICD-10-CM | POA: Diagnosis not present

## 2022-12-15 DIAGNOSIS — K8051 Calculus of bile duct without cholangitis or cholecystitis with obstruction: Secondary | ICD-10-CM | POA: Diagnosis not present

## 2022-12-15 DIAGNOSIS — R7401 Elevation of levels of liver transaminase levels: Secondary | ICD-10-CM | POA: Diagnosis not present

## 2022-12-15 DIAGNOSIS — K831 Obstruction of bile duct: Secondary | ICD-10-CM | POA: Diagnosis not present

## 2022-12-15 DIAGNOSIS — I4891 Unspecified atrial fibrillation: Secondary | ICD-10-CM | POA: Diagnosis not present

## 2022-12-15 DIAGNOSIS — R1084 Generalized abdominal pain: Secondary | ICD-10-CM | POA: Diagnosis not present

## 2022-12-15 DIAGNOSIS — Z981 Arthrodesis status: Secondary | ICD-10-CM | POA: Diagnosis not present

## 2022-12-15 DIAGNOSIS — K8010 Calculus of gallbladder with chronic cholecystitis without obstruction: Secondary | ICD-10-CM | POA: Diagnosis not present

## 2022-12-15 DIAGNOSIS — K8050 Calculus of bile duct without cholangitis or cholecystitis without obstruction: Secondary | ICD-10-CM | POA: Diagnosis not present

## 2022-12-15 DIAGNOSIS — R634 Abnormal weight loss: Secondary | ICD-10-CM | POA: Diagnosis not present

## 2022-12-15 DIAGNOSIS — Z887 Allergy status to serum and vaccine status: Secondary | ICD-10-CM | POA: Diagnosis not present

## 2022-12-15 DIAGNOSIS — Z5331 Laparoscopic surgical procedure converted to open procedure: Secondary | ICD-10-CM | POA: Diagnosis not present

## 2022-12-15 DIAGNOSIS — K660 Peritoneal adhesions (postprocedural) (postinfection): Secondary | ICD-10-CM | POA: Diagnosis not present

## 2022-12-15 DIAGNOSIS — K838 Other specified diseases of biliary tract: Secondary | ICD-10-CM | POA: Diagnosis not present

## 2022-12-15 DIAGNOSIS — R17 Unspecified jaundice: Secondary | ICD-10-CM | POA: Diagnosis not present

## 2022-12-15 DIAGNOSIS — Z9884 Bariatric surgery status: Secondary | ICD-10-CM | POA: Diagnosis not present

## 2022-12-15 DIAGNOSIS — I10 Essential (primary) hypertension: Secondary | ICD-10-CM | POA: Diagnosis not present

## 2022-12-27 DIAGNOSIS — Z008 Encounter for other general examination: Secondary | ICD-10-CM | POA: Diagnosis not present

## 2022-12-27 DIAGNOSIS — R269 Unspecified abnormalities of gait and mobility: Secondary | ICD-10-CM | POA: Diagnosis not present

## 2022-12-27 DIAGNOSIS — K5900 Constipation, unspecified: Secondary | ICD-10-CM | POA: Diagnosis not present

## 2022-12-27 DIAGNOSIS — K219 Gastro-esophageal reflux disease without esophagitis: Secondary | ICD-10-CM | POA: Diagnosis not present

## 2022-12-27 DIAGNOSIS — M545 Low back pain, unspecified: Secondary | ICD-10-CM | POA: Diagnosis not present

## 2022-12-27 DIAGNOSIS — G4733 Obstructive sleep apnea (adult) (pediatric): Secondary | ICD-10-CM | POA: Diagnosis not present

## 2022-12-27 DIAGNOSIS — E785 Hyperlipidemia, unspecified: Secondary | ICD-10-CM | POA: Diagnosis not present

## 2022-12-27 DIAGNOSIS — N529 Male erectile dysfunction, unspecified: Secondary | ICD-10-CM | POA: Diagnosis not present

## 2022-12-27 DIAGNOSIS — I471 Supraventricular tachycardia, unspecified: Secondary | ICD-10-CM | POA: Diagnosis not present

## 2022-12-27 DIAGNOSIS — I10 Essential (primary) hypertension: Secondary | ICD-10-CM | POA: Diagnosis not present

## 2022-12-27 DIAGNOSIS — M1990 Unspecified osteoarthritis, unspecified site: Secondary | ICD-10-CM | POA: Diagnosis not present

## 2022-12-27 DIAGNOSIS — F32A Depression, unspecified: Secondary | ICD-10-CM | POA: Diagnosis not present

## 2023-01-03 DIAGNOSIS — R1013 Epigastric pain: Secondary | ICD-10-CM | POA: Diagnosis not present

## 2023-01-03 DIAGNOSIS — G629 Polyneuropathy, unspecified: Secondary | ICD-10-CM | POA: Diagnosis not present

## 2023-01-03 DIAGNOSIS — Z09 Encounter for follow-up examination after completed treatment for conditions other than malignant neoplasm: Secondary | ICD-10-CM | POA: Diagnosis not present

## 2023-01-03 DIAGNOSIS — R7989 Other specified abnormal findings of blood chemistry: Secondary | ICD-10-CM | POA: Diagnosis not present

## 2023-01-10 DIAGNOSIS — Z8739 Personal history of other diseases of the musculoskeletal system and connective tissue: Secondary | ICD-10-CM | POA: Diagnosis not present

## 2023-01-10 DIAGNOSIS — R6889 Other general symptoms and signs: Secondary | ICD-10-CM | POA: Diagnosis not present

## 2023-01-10 DIAGNOSIS — R7989 Other specified abnormal findings of blood chemistry: Secondary | ICD-10-CM | POA: Diagnosis not present

## 2023-01-10 DIAGNOSIS — I4891 Unspecified atrial fibrillation: Secondary | ICD-10-CM | POA: Diagnosis not present

## 2023-01-10 DIAGNOSIS — K5900 Constipation, unspecified: Secondary | ICD-10-CM | POA: Diagnosis not present

## 2023-01-10 DIAGNOSIS — K819 Cholecystitis, unspecified: Secondary | ICD-10-CM | POA: Diagnosis not present

## 2023-01-10 DIAGNOSIS — R978 Other abnormal tumor markers: Secondary | ICD-10-CM | POA: Diagnosis not present

## 2023-01-11 ENCOUNTER — Telehealth: Payer: Self-pay | Admitting: Gastroenterology

## 2023-01-11 NOTE — Telephone Encounter (Signed)
Hi Dr. Barron Alvine,  We received a referral for this patient to have another colonoscopy that he is due for. His last Colonoscopy was done by you in 2020. Since then he has saw another Gastroenterology provider within the [facility] network. He states that he was unaware that if he saw another Gastroenterology provider for other reasons and not a Colonoscopy he would not be able to be seen with [place]. He is wanting to continue his care with you for his Colonoscopy. His recent records are in [facility] for you to review and advise on scheduling.   Thank you.

## 2023-01-16 NOTE — Telephone Encounter (Signed)
Spoke with patient, states he will call back to schedule colonoscopy since his daughter will have to be his care partner.

## 2023-01-16 NOTE — Telephone Encounter (Signed)
Called patient to advise on scheduling, left voicemail.  

## 2023-01-30 ENCOUNTER — Ambulatory Visit (AMBULATORY_SURGERY_CENTER): Payer: Medicare HMO

## 2023-01-30 ENCOUNTER — Other Ambulatory Visit: Payer: Self-pay

## 2023-01-30 ENCOUNTER — Encounter: Payer: Self-pay | Admitting: Gastroenterology

## 2023-01-30 VITALS — Ht 74.0 in | Wt 285.0 lb

## 2023-01-30 DIAGNOSIS — Z86010 Personal history of colonic polyps: Secondary | ICD-10-CM

## 2023-01-30 MED ORDER — NA SULFATE-K SULFATE-MG SULF 17.5-3.13-1.6 GM/177ML PO SOLN: 1.0000 | Freq: Once | ORAL | 0 refills | Status: AC

## 2023-01-30 NOTE — Progress Notes (Signed)
Denies allergies to eggs or soy products. Denies complication of anesthesia or sedation. Denies use of weight loss medication. Denies use of O2.   Emmi instructions given for colonoscopy.  

## 2023-02-07 ENCOUNTER — Encounter: Payer: Self-pay | Admitting: Certified Registered Nurse Anesthetist

## 2023-02-10 ENCOUNTER — Other Ambulatory Visit (HOSPITAL_BASED_OUTPATIENT_CLINIC_OR_DEPARTMENT_OTHER): Payer: Self-pay | Admitting: Nurse Practitioner

## 2023-02-10 DIAGNOSIS — I10 Essential (primary) hypertension: Secondary | ICD-10-CM

## 2023-02-11 ENCOUNTER — Other Ambulatory Visit (HOSPITAL_BASED_OUTPATIENT_CLINIC_OR_DEPARTMENT_OTHER): Payer: Self-pay | Admitting: Nurse Practitioner

## 2023-02-11 DIAGNOSIS — M1990 Unspecified osteoarthritis, unspecified site: Secondary | ICD-10-CM

## 2023-02-14 ENCOUNTER — Ambulatory Visit (AMBULATORY_SURGERY_CENTER): Payer: Medicare HMO | Admitting: Gastroenterology

## 2023-02-14 ENCOUNTER — Encounter: Payer: Self-pay | Admitting: Gastroenterology

## 2023-02-14 VITALS — BP 111/61 | HR 65 | Temp 98.9°F | Resp 9 | Ht 74.0 in | Wt 285.0 lb

## 2023-02-14 DIAGNOSIS — Z86010 Personal history of colonic polyps: Secondary | ICD-10-CM | POA: Diagnosis not present

## 2023-02-14 DIAGNOSIS — Z09 Encounter for follow-up examination after completed treatment for conditions other than malignant neoplasm: Secondary | ICD-10-CM

## 2023-02-14 DIAGNOSIS — D128 Benign neoplasm of rectum: Secondary | ICD-10-CM

## 2023-02-14 DIAGNOSIS — G4733 Obstructive sleep apnea (adult) (pediatric): Secondary | ICD-10-CM | POA: Diagnosis not present

## 2023-02-14 DIAGNOSIS — K621 Rectal polyp: Secondary | ICD-10-CM | POA: Diagnosis not present

## 2023-02-14 DIAGNOSIS — I10 Essential (primary) hypertension: Secondary | ICD-10-CM | POA: Diagnosis not present

## 2023-02-14 MED ORDER — SODIUM CHLORIDE 0.9 % IV SOLN
500.0000 mL | Freq: Once | INTRAVENOUS | Status: DC
Start: 2023-02-14 — End: 2023-06-20

## 2023-02-14 NOTE — Op Note (Signed)
[facility] Patient Name: Sean Arnold Procedure Date: 02/14/2023 1:35 PM MRN: [id number] Endoscopist: Doristine Locks , MD, [id number] Age: 67 Referring MD:  Date of Birth: Aug 25, 1956 Gender: Male Account #: [id number] Procedure:                Colonoscopy Indications:              Screening for colorectal malignant neoplasm,                            inadequate bowel prep on last colonoscopy (more                            recent than 10 years ago)                           Last colonoscopy was 05/2019 (positive Cologuard)                            and notable for localized area of mildly                            erythematous mucosa in the distal 1-2 cm of the                            rectum (path benign), with otherwise                            normal-appearing colon. Suboptimal prep and                            recommended repeat colonoscopy in 1 year with                            extended 2-day bowel preparation. He returns today                            for repeat colonoscopy and has completed an                            extended multi-day bowel prep.                           Prior to that, colonoscopy in 08/2012 with single                            ulcer in the distal rectum and single rectal polyp. Medicines:                Monitored Anesthesia Care Procedure:                Pre-Anesthesia Assessment:                           - Prior to the procedure, a History and Physical  was performed, and patient medications and                            allergies were reviewed. The patient's tolerance of                            previous anesthesia was also reviewed. The risks                            and benefits of the procedure and the sedation                            options and risks were discussed with the patient.                            All questions were answered, and informed consent                             was obtained. Prior Anticoagulants: The patient has                            taken no anticoagulant or antiplatelet agents                            except for aspirin. ASA Grade Assessment: II - A                            patient with mild systemic disease. After reviewing                            the risks and benefits, the patient was deemed in                            satisfactory condition to undergo the procedure.                           After obtaining informed consent, the colonoscope                            was passed under direct vision. Throughout the                            procedure, the patient's blood pressure, pulse, and                            oxygen saturations were monitored continuously. The                            CF [id number] #[id number] was introduced through the anus                            and advanced to the the cecum, identified by  appendiceal orifice and ileocecal valve. The                            colonoscopy was performed without difficulty. The                            patient tolerated the procedure well. The quality                            of the bowel preparation was good. The ileocecal                            valve, appendiceal orifice, and rectum were                            photographed. Scope In: 2:01:08 PM Scope Out: 2:15:26 PM Scope Withdrawal Time: 0 hours 11 minutes 3 seconds  Total Procedure Duration: 0 hours 14 minutes 18 seconds  Findings:                 The perianal and digital rectal examinations were                            normal.                           A 3 mm polyp was found in the rectum. The polyp was                            sessile. The polyp was removed with a cold snare.                            Resection and retrieval were complete. Estimated                            blood loss was minimal.                           The exam was otherwise normal throughout the                             remainder of the colon.                           The retroflexed view of the distal rectum and anal                            verge was normal and showed no anal or rectal                            abnormalities. Complications:            No immediate complications. Estimated Blood Loss:     Estimated blood loss was minimal. Impression:               - One 3 mm polyp in the  rectum, removed with a cold                            snare. Resected and retrieved.                           - Otherwise, normal colonoscopy.                           - The distal rectum and anal verge are normal on                            retroflexion view. Recommendation:           - Patient has a contact number available for                            emergencies. The signs and symptoms of potential                            delayed complications were discussed with the                            patient. Return to normal activities tomorrow.                            Written discharge instructions were provided to the                            patient.                           - Resume previous diet.                           - Continue present medications.                           - Await pathology results.                           - Repeat colonoscopy in 5-10 years for surveillance                            based on pathology results.                           - Return to GI office PRN. Doristine Locks, MD 02/14/2023 2:20:47 PM

## 2023-02-14 NOTE — Progress Notes (Signed)
VS completed by DT.  Pt's states no medical or surgical changes since previsit or office visit.  

## 2023-02-14 NOTE — Patient Instructions (Signed)
Thank you for coming in to see [place] today!   Resume your normal diet and medications today. Return to regular daily activities tomorrow. Next colonoscopy will be recommended after today's pathology results are final.    YOU HAD AN ENDOSCOPIC PROCEDURE TODAY AT THE [facility]:   Refer to the procedure report that was given to you for any specific questions about what was found during the examination.  If the procedure report does not answer your questions, please call your gastroenterologist to clarify.  If you requested that your care partner not be given the details of your procedure findings, then the procedure report has been included in a sealed envelope for you to review at your convenience later.  YOU SHOULD EXPECT: Some feelings of bloating in the abdomen. Passage of more gas than usual.  Walking can help get rid of the air that was put into your GI tract during the procedure and reduce the bloating. If you had a lower endoscopy (such as a colonoscopy or flexible sigmoidoscopy) you may notice spotting of blood in your stool or on the toilet paper. If you underwent a bowel prep for your procedure, you may not have a normal bowel movement for a few days.  Please Note:  You might notice some irritation and congestion in your nose or some drainage.  This is from the oxygen used during your procedure.  There is no need for concern and it should clear up in a day or so.  SYMPTOMS TO REPORT IMMEDIATELY:  Following lower endoscopy (colonoscopy or flexible sigmoidoscopy):  Excessive amounts of blood in the stool  Significant tenderness or worsening of abdominal pains  Swelling of the abdomen that is new, acute  Fever of 100F or higher    For urgent or emergent issues, a gastroenterologist can be reached at any hour by calling [phone]. Do not use MyChart messaging for urgent concerns.    DIET:  We do recommend a small meal at first, but then you may proceed to your regular  diet.  Drink plenty of fluids but you should avoid alcoholic beverages for 24 hours.  ACTIVITY:  You should plan to take it easy for the rest of today and you should NOT DRIVE or use heavy machinery until tomorrow (because of the sedation medicines used during the test).    FOLLOW UP: Our staff will call the number listed on your records the next business day following your procedure.  We will call around 7:15- 8:00 am to check on you and address any questions or concerns that you may have regarding the information given to you following your procedure. If we do not reach you, we will leave a message.     If any biopsies were taken you will be contacted by phone or by letter within the next 1-3 weeks.  Please call us at [phone] if you have not heard about the biopsies in 3 weeks.    SIGNATURES/CONFIDENTIALITY: You and/or your care partner have signed paperwork which will be entered into your electronic medical record.  These signatures attest to the fact that that the information above on your After Visit Summary has been reviewed and is understood.  Full responsibility of the confidentiality of this discharge information lies with you and/or your care-partner.

## 2023-02-14 NOTE — Progress Notes (Signed)
Report given to PACU, vss 

## 2023-02-14 NOTE — Progress Notes (Signed)
Called to room to assist during endoscopic procedure.  Patient ID and intended procedure confirmed with present staff. Received instructions for my participation in the procedure from the performing physician.  

## 2023-02-14 NOTE — Progress Notes (Signed)
GASTROENTEROLOGY PROCEDURE H&P NOTE   Primary Care Physician: Gayla Doss, MD    Reason for Procedure:  Colon Cancer screening  Plan:    Colonoscopy  Patient is appropriate for endoscopic procedure(s) in the ambulatory ([facility]) setting.  The nature of the procedure, as well as the risks, benefits, and alternatives were carefully and thoroughly reviewed with the patient. Ample time for discussion and questions allowed. The patient understood, was satisfied, and agreed to proceed.     HPI: Sean Arnold is a 67 y.o. male who presents for colonoscopy for ongoing Colon Cancer screening.  No active GI symptoms.    Last colonoscopy was 05/2019 (positive Cologuard) and notable for localized area of mildly erythematous mucosa in the distal 1-2 cm of the rectum (path benign), with otherwise normal-appearing colon.  Suboptimal prep and recommended repeat colonoscopy in 1 year with extended 2-day bowel preparation.  He returns today for repeat colonoscopy and has completed a 2-day bowel prep.  Prior to that, colonoscopy in 08/2012 with single ulcer in the distal rectum and single rectal polyp.  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    Neuromuscular disorder (HCC)    neuropathy  both legs and feet   Peripheral neuropathy 04/02/2019   PONV (postoperative nausea and vomiting)    Severe  nausea   Sleep apnea    CPAP     Past Surgical History:  Procedure Laterality Date   ablation for atrial fib     ACHILLES TENDON REPAIR     arthroscopic knee Bilateral    BACK SURGERY     times four   CERVICAL FUSION     CHOLECYSTECTOMY     LAMINECTOMY     LAPAROSCOPIC GASTRIC SLEEVE RESECTION     TOTAL KNEE ARTHROPLASTY Left 01/27/2018    Procedure: LEFT TOTAL KNEE ARTHROPLASTY;  Surgeon: Dannielle Huh, MD;  Location: [facility] OR;  Service: Orthopedics;  Laterality: Left;    Prior to Admission medications   Medication Sig Start Date End Date Taking? Authorizing Provider  acetaminophen (TYLENOL) 500 MG tablet Take 250 mg by mouth every 6 (six) hours as needed (for pain.).    [provider]  aspirin EC 81 MG tablet Take 81 mg by mouth daily. Swallow whole.    [provider]  carvedilol (COREG) 12.5 MG tablet Take 12.5 mg by mouth 2 (two) times daily. 07/17/21   [provider]  diclofenac (VOLTAREN) 75 MG EC tablet TAKE 1 TABLET BY MOUTH TWICE A DAY 02/07/22   Early, Sung Amabile, NP  furosemide (LASIX) 20 MG tablet Take 20 mg by mouth daily. 07/17/21   [provider]  gabapentin (NEURONTIN) 300 MG capsule Take 300 mg by mouth 3 (three) times daily. 03/10/21   [provider]  hydrOXYzine (VISTARIL) 25 MG capsule Take 25 mg by mouth at bedtime as needed. 04/06/21   [provider]  lisinopril-hydrochlorothiazide (ZESTORETIC) 20-12.5 MG tablet TAKE 2 TABLETS BY MOUTH DAILY 02/07/22   Early, Sung Amabile, NP  Magnesium Gluconate 550 MG TABS     [provider]  Methylnaltrexone Bromide 150 MG TABS Take 150 mg by mouth as needed.  [provider]  Multiple Vitamin (MULTIVITAMIN) tablet Take 1 tablet by mouth daily.    [provider]  OVER THE COUNTER MEDICATION Stool softener, one capsule daily.    [provider]  potassium chloride (KLOR-CON) 10 MEQ tablet Take by mouth.    [provider]  rosuvastatin (CRESTOR) 40 MG tablet Take 40 mg by mouth daily. 05/03/21   [provider]  SODIUM FLUORIDE 5000 PPM 1.1 % PSTE SMARTSIG:Sparingly By Mouth Daily 07/19/21   [provider]  traZODone (DESYREL) 100 MG tablet Take 1 tablet (100 mg total) by mouth at bedtime. 07/31/21   Tollie Eth, NP  triamcinolone (KENALOG) 0.025 % ointment Apply 1  application. topically 2 (two) times daily. 01/02/22   Vivi Barrack, DPM    Current Outpatient Medications  Medication Sig Dispense Refill   acetaminophen (TYLENOL) 500 MG tablet Take 250 mg by mouth every 6 (six) hours as needed (for pain.).     aspirin EC 81 MG tablet Take 81 mg by mouth daily. Swallow whole.     carvedilol (COREG) 12.5 MG tablet Take 12.5 mg by mouth 2 (two) times daily.     diclofenac (VOLTAREN) 75 MG EC tablet TAKE 1 TABLET BY MOUTH TWICE A DAY 180 tablet 3   furosemide (LASIX) 20 MG tablet Take 20 mg by mouth daily.     gabapentin (NEURONTIN) 300 MG capsule Take 300 mg by mouth 3 (three) times daily.     hydrOXYzine (VISTARIL) 25 MG capsule Take 25 mg by mouth at bedtime as needed.     lisinopril-hydrochlorothiazide (ZESTORETIC) 20-12.5 MG tablet TAKE 2 TABLETS BY MOUTH DAILY 180 tablet 3   Magnesium Gluconate 550 MG TABS      Methylnaltrexone Bromide 150 MG TABS Take 150 mg by mouth as needed.      Multiple Vitamin (MULTIVITAMIN) tablet Take 1 tablet by mouth daily.     OVER THE COUNTER MEDICATION Stool softener, one capsule daily.     potassium chloride (KLOR-CON) 10 MEQ tablet Take by mouth.     rosuvastatin (CRESTOR) 40 MG tablet Take 40 mg by mouth daily.     SODIUM FLUORIDE 5000 PPM 1.1 % PSTE SMARTSIG:Sparingly By Mouth Daily     traZODone (DESYREL) 100 MG tablet Take 1 tablet (100 mg total) by mouth at bedtime. 90 tablet 1   triamcinolone (KENALOG) 0.025 % ointment Apply 1 application. topically 2 (two) times daily. 30 g 0   No current facility-administered medications for this visit.    Allergies as of 02/14/2023 - Review Complete 02/07/2023  Allergen Reaction Noted   Dofetilide Shortness Of Breath 08/21/2006   Morphine and codeine Anaphylaxis and Shortness Of Breath 05/22/2012   Codeine Other (See Comments) 03/10/2021   Haemophilus influenzae vaccines Other (See Comments) 10/03/2020   Influenza vac split quad Other (See Comments) 03/05/2019     Family History  Problem Relation Age of Onset   GI problems Mother    Arthritis Mother    Depression Mother    Pneumonia Father    Atrial fibrillation Father    Alcohol abuse Father    Arthritis Father    Depression Father    Depression Sister    Arthritis Sister    Arthritis Brother    Depression Brother    Hyperlipidemia Brother    Hypertension Brother    Colon cancer Maternal Uncle    Esophageal cancer Neg Hx    Stomach cancer Neg Hx    Rectal cancer  Neg Hx     Social History   Socioeconomic History   Marital status: Divorced    Spouse name: Not on file   Number of children: 2   Years of education: Not on file   Highest education level: Not on file  Occupational History   Not on file  Tobacco Use   Smoking status: Former    Years: 40    Types: Cigarettes   Smokeless tobacco: Never  Vaping Use   Vaping Use: Never used  Substance and Sexual Activity   Alcohol use: Yes    Comment: rarely   Drug use: Never   Sexual activity: Never  Other Topics Concern   Not on file  Social History Narrative   Right handed    Caffeine 5-6 cups daily   Lives at home with dog max    Social Determinants of Health   Financial Resource Strain: Not on file  Food Insecurity: Not on file  Transportation Needs: Not on file  Physical Activity: Not on file  Stress: Not on file  Social Connections: Not on file  Intimate Partner Violence: Not on file    Physical Exam: Vital signs in last 24 hours: @There  were no vitals taken for this visit. GEN: NAD EYE: Sclerae anicteric ENT: MMM CV: Non-tachycardic Pulm: CTA b/l GI: Soft, NT/ND NEURO:  Alert & Oriented x 3   Doristine Locks, DO [facility]   02/14/2023 1:16 PM

## 2023-02-15 ENCOUNTER — Telehealth: Payer: Self-pay | Admitting: *Deleted

## 2023-02-15 NOTE — Telephone Encounter (Signed)
  Follow up Call-     02/14/2023    1:23 PM  Call back number  Post procedure Call Back phone  # [phone]  Permission to leave phone message Yes     Patient questions:  Do you have a fever, pain , or abdominal swelling? No. Pain Score  0 *  Have you tolerated food without any problems? Yes.    Have you been able to return to your normal activities? Yes.    Do you have any questions about your discharge instructions: Diet   No. Medications  No. Follow up visit  No.  Do you have questions or concerns about your Care? No.  Actions: * If pain score is 4 or above: No action needed, pain <4.

## 2023-02-20 DIAGNOSIS — I4891 Unspecified atrial fibrillation: Secondary | ICD-10-CM | POA: Diagnosis not present

## 2023-02-20 DIAGNOSIS — G4733 Obstructive sleep apnea (adult) (pediatric): Secondary | ICD-10-CM | POA: Diagnosis not present

## 2023-02-20 DIAGNOSIS — E669 Obesity, unspecified: Secondary | ICD-10-CM | POA: Diagnosis not present

## 2023-02-20 DIAGNOSIS — I10 Essential (primary) hypertension: Secondary | ICD-10-CM | POA: Diagnosis not present

## 2023-03-07 ENCOUNTER — Ambulatory Visit: Admit: 2023-03-07 | Payer: Medicare HMO | Admitting: Gastroenterology

## 2023-03-07 SURGERY — ESOPHAGOGASTRODUODENOSCOPY (EGD) WITH PROPOFOL
Anesthesia: General

## 2023-03-25 DIAGNOSIS — E669 Obesity, unspecified: Secondary | ICD-10-CM | POA: Diagnosis not present

## 2023-03-25 DIAGNOSIS — Z6836 Body mass index (BMI) 36.0-36.9, adult: Secondary | ICD-10-CM | POA: Diagnosis not present

## 2023-03-25 DIAGNOSIS — G8929 Other chronic pain: Secondary | ICD-10-CM | POA: Diagnosis not present

## 2023-03-25 DIAGNOSIS — I10 Essential (primary) hypertension: Secondary | ICD-10-CM | POA: Diagnosis not present

## 2023-03-25 DIAGNOSIS — R978 Other abnormal tumor markers: Secondary | ICD-10-CM | POA: Diagnosis not present

## 2023-03-25 DIAGNOSIS — M25551 Pain in right hip: Secondary | ICD-10-CM | POA: Diagnosis not present

## 2023-03-25 DIAGNOSIS — G4733 Obstructive sleep apnea (adult) (pediatric): Secondary | ICD-10-CM | POA: Diagnosis not present

## 2023-03-25 DIAGNOSIS — I4891 Unspecified atrial fibrillation: Secondary | ICD-10-CM | POA: Diagnosis not present

## 2023-03-25 DIAGNOSIS — R6889 Other general symptoms and signs: Secondary | ICD-10-CM | POA: Diagnosis not present

## 2023-03-25 DIAGNOSIS — M1611 Unilateral primary osteoarthritis, right hip: Secondary | ICD-10-CM | POA: Diagnosis not present

## 2023-04-05 DIAGNOSIS — M9973 Connective tissue and disc stenosis of intervertebral foramina of lumbar region: Secondary | ICD-10-CM | POA: Diagnosis not present

## 2023-04-05 DIAGNOSIS — M166 Other bilateral secondary osteoarthritis of hip: Secondary | ICD-10-CM | POA: Diagnosis not present

## 2023-04-05 DIAGNOSIS — M5416 Radiculopathy, lumbar region: Secondary | ICD-10-CM | POA: Diagnosis not present

## 2023-04-15 NOTE — Progress Notes (Signed)
Referring Physician:  Reinaldo Berber, MD [address] [place],  [place] [address]  Primary Physician:  Gayla Doss, MD  History of Present Illness: 04/15/2023 Sean Arnold is here today with a chief complaint of right lower extremity pain.  He has a extensive neurosurgical and spinal history as well as orthopedic history.  He has had multiple cervical interventions as well as multiple lumbar interventions.  He has had cervical corpectomy as well as lumbar DLIF.  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  He is here today to evaluate for any possible radiculopathy or femoral neuropathy.  He feels pain in his groin line that is aching and intermittently sharp especially when he moves his leg.  When he is able to keep his leg still without motion he does not have significant exacerbation.  He often will cross his right leg over top of his left and when he returns at back to a neutral position he notices significant pain.  This will often bother him in his right gluteal region.  He did have a previous exacerbation of sharp stabbing pain in the left side in his upper buttocks and lower back.  This sounded somewhat consistent with a cluneal type nerve pain, however he felt a popping sensation once when working through his pain and this resolved significantly.  Notably he does have an extensive history of neuropathy, he has had previous nerve conduction studies which per report have shown a lack of conduction in his bilateral lower extremities  Conservative measures:  Physical therapy: No Multimodal medical therapy including regular antiinflammatories:  tylenol, diclofenac, gabapentin, Toradol, Meloxicam Injections:  has not received any epidural steroid injections  Past Surgery: Disectomy 2005 Lumbar Fusion  08/2021 Cervical Fusion 2006   I have utilized the care everywhere  function in epic to review the outside records available from external health systems.  Review of Systems:  A 10 point review of systems is negative, except for the pertinent positives and negatives detailed in the HPI.  Past Medical History: Past Medical History:  Diagnosis Date   A-fib [facility])    Anxiety    Arthritis    Brachial plexopathy 11/27/2018   Left    Complication of anesthesia    Depression    Dysrhythmia    a-Fib    ablation   Hypertension    MGUS (monoclonal gammopathy of unknown significance) 04/02/2019   IgG    Neuromuscular disorder (HCC)    neuropathy  both legs and feet   Peripheral neuropathy 04/02/2019   PONV (postoperative nausea and vomiting)    Severe  nausea   Sleep apnea    CPAP     Past Surgical History: Past Surgical History:  Procedure Laterality Date   ablation for atrial fib     ACHILLES TENDON REPAIR     arthroscopic knee Bilateral    BACK SURGERY     times four   CERVICAL FUSION     CHOLECYSTECTOMY     LAMINECTOMY     LAPAROSCOPIC GASTRIC SLEEVE RESECTION     TOTAL KNEE ARTHROPLASTY Left 01/27/2018   Procedure: LEFT TOTAL KNEE ARTHROPLASTY;  Surgeon: Dannielle Huh, MD;  Location: [facility] OR;  Service: Orthopedics;  Laterality: Left;    Allergies: Allergies as of 04/24/2023 - Review Complete 02/14/2023  Allergen Reaction Noted   Dofetilide Shortness Of Breath 08/21/2006   Morphine and codeine  Anaphylaxis and Shortness Of Breath 05/22/2012   Codeine Other (See Comments) 03/10/2021   Haemophilus influenzae vaccines Other (See Comments) 10/03/2020   Influenza vac split quad Other (See Comments) 03/05/2019    Medications:  Current Outpatient Medications:    acetaminophen (TYLENOL) 500 MG tablet, Take 250 mg by mouth every 6 (six) hours as needed (for pain.)., Disp: , Rfl:    aspirin EC 81 MG tablet, Take 81 mg by mouth daily. Swallow whole., Disp: , Rfl:    carvedilol (COREG) 12.5 MG tablet, Take 12.5 mg by mouth 2 (two) times daily., Disp:  , Rfl:    diclofenac (VOLTAREN) 75 MG EC tablet, TAKE 1 TABLET BY MOUTH TWICE A DAY, Disp: 180 tablet, Rfl: 3   furosemide (LASIX) 20 MG tablet, Take 20 mg by mouth daily., Disp: , Rfl:    gabapentin (NEURONTIN) 300 MG capsule, Take 300 mg by mouth 3 (three) times daily., Disp: , Rfl:    hydrOXYzine (VISTARIL) 25 MG capsule, Take 25 mg by mouth at bedtime as needed., Disp: , Rfl:    lisinopril-hydrochlorothiazide (ZESTORETIC) 20-12.5 MG tablet, TAKE 2 TABLETS BY MOUTH DAILY, Disp: 180 tablet, Rfl: 3   Magnesium Gluconate 550 MG TABS, , Disp: , Rfl:    Methylnaltrexone Bromide 150 MG TABS, Take 150 mg by mouth as needed. , Disp: , Rfl:    Multiple Vitamin (MULTIVITAMIN) tablet, Take 1 tablet by mouth daily., Disp: , Rfl:    OVER THE COUNTER MEDICATION, Stool softener, one capsule daily., Disp: , Rfl:    potassium chloride (KLOR-CON) 10 MEQ tablet, Take by mouth., Disp: , Rfl:    rosuvastatin (CRESTOR) 40 MG tablet, Take 40 mg by mouth daily., Disp: , Rfl:    SODIUM FLUORIDE 5000 PPM 1.1 % PSTE, SMARTSIG:Sparingly By Mouth Daily, Disp: , Rfl:    traZODone (DESYREL) 100 MG tablet, Take 1 tablet (100 mg total) by mouth at bedtime., Disp: 90 tablet, Rfl: 1   triamcinolone (KENALOG) 0.025 % ointment, Apply 1 application. topically 2 (two) times daily., Disp: 30 g, Rfl: 0  Current Facility-Administered Medications:    0.9 %  sodium chloride infusion, 500 mL, Intravenous, Once, Cirigliano, Vito V, DO  Social History: Social History   Tobacco Use   Smoking status: Former    Types: Cigarettes   Smokeless tobacco: Never  Vaping Use   Vaping status: Never Used  Substance Use Topics   Alcohol use: Yes    Comment: rarely   Drug use: Never    Family Medical History: Family History  Problem Relation Age of Onset   GI problems Mother    Arthritis Mother    Depression Mother    Pneumonia Father    Atrial fibrillation Father    Alcohol abuse Father    Arthritis Father    Depression Father     Depression Sister    Arthritis Sister    Arthritis Brother    Depression Brother    Hyperlipidemia Brother    Hypertension Brother    Colon cancer Maternal Uncle    Esophageal cancer Neg Hx    Stomach cancer Neg Hx    Rectal cancer Neg Hx     Physical Examination: There were no vitals filed for this visit.  General: Patient is in no apparent distress. Attention to examination is appropriate.  Neck:   Supple.  Full range of motion.  Respiratory: Patient is breathing without any difficulty.   NEUROLOGICAL:     Awake, alert, oriented to person, place,  and time.  Speech is clear and fluent.   Cranial Nerves: Pupils equal round and reactive to light.  Facial tone is symmetric.  Facial sensation is symmetric. Shoulder shrug is symmetric. Tongue protrusion is midline.    Strength: He does have significant pain in the right groin/hip when activating his proximal musculature.  This does have somewhat limited exam.  But to the extent that if the exam is able to be performed no gross motor deficits noted.  Reflexes are are diminished in the bilateral lower extremities.  Clonus is absent.  Decreased sensation in a circumferential fashion throughout the entire lower extremities approximately to mid thigh.     No evidence of dysmetria noted.  He utilizes a 4 wheeled walker  Imaging: We do not have any recent imaging to evaluate.  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  He has since had multilevel lumbar fusion.  Medical Decision Making/Assessment and Plan: Postural kyphosis of thoracolumbar region Chronic bilateral low back pain without sciatica Pain of right hip S/P lumbar spinal fusion Polyneuropathy, unspecified  Sean Arnold is a pleasant 66 y.o. male with significant hip region pain and back pain.  He does have a history of lumbar radiculopathy and lumbar deformity for which she has had a previous multilevel  lateral interbody fusion surgery.  He is also had cervical spine surgery including a corpectomy.  His complaint currently is most consistent with groin region pain which is worse with both active and passive range of motion.  His hip tests including Pearlean Brownie are positive for provocation of his pain.  His straight leg raise is negative.  He does not have any sensation loss in his anterior thigh.  He is pain is mostly on the medial aspect of his upper thigh and groin.  His reflexes are unable to be elicited secondary to his polyneuropathy.  We do not feel that he has a significant presentation of a radiculopathy.  This could be further evaluated with a neurodiagnostic studies such as electrodiagnostics and EMG/NCS.  He would like to hold off on this until he has his hip evaluated and treated.  We agree with this plan.  In regards to his low back I like to get scoliosis films to evaluate his spinal alignment.  He feels that he has continually fallen forward and feels increased fatigue in his back.  Like to evaluate for any adjacent level disease.   Thank you for involving me in the care of this patient.    Lovenia Kim MD/MSCR Neurosurgery

## 2023-04-16 DIAGNOSIS — M25551 Pain in right hip: Secondary | ICD-10-CM | POA: Diagnosis not present

## 2023-04-23 DIAGNOSIS — M25561 Pain in right knee: Secondary | ICD-10-CM | POA: Diagnosis not present

## 2023-04-23 DIAGNOSIS — M25551 Pain in right hip: Secondary | ICD-10-CM | POA: Diagnosis not present

## 2023-04-24 ENCOUNTER — Encounter: Payer: Self-pay | Admitting: Neurosurgery

## 2023-04-24 ENCOUNTER — Ambulatory Visit
Admission: RE | Admit: 2023-04-24 | Discharge: 2023-04-24 | Disposition: A | Discharge status: Home / Self Care | Payer: Medicare HMO | Source: Ambulatory Visit | Attending: Neurosurgery | Admitting: Neurosurgery

## 2023-04-24 ENCOUNTER — Ambulatory Visit
Admission: RE | Admit: 2023-04-24 | Discharge: 2023-04-24 | Disposition: A | Discharge status: Home / Self Care | Payer: Medicare HMO | Attending: Neurosurgery | Admitting: Neurosurgery

## 2023-04-24 ENCOUNTER — Ambulatory Visit: Payer: Medicare HMO | Admitting: Neurosurgery

## 2023-04-24 VITALS — BP 128/92 | Ht 74.0 in | Wt 287.0 lb

## 2023-04-24 DIAGNOSIS — M25551 Pain in right hip: Secondary | ICD-10-CM | POA: Diagnosis not present

## 2023-04-24 DIAGNOSIS — G629 Polyneuropathy, unspecified: Secondary | ICD-10-CM

## 2023-04-24 DIAGNOSIS — M4005 Postural kyphosis, thoracolumbar region: Secondary | ICD-10-CM | POA: Diagnosis not present

## 2023-04-24 DIAGNOSIS — M438X6 Other specified deforming dorsopathies, lumbar region: Secondary | ICD-10-CM | POA: Diagnosis not present

## 2023-04-24 DIAGNOSIS — G8929 Other chronic pain: Secondary | ICD-10-CM

## 2023-04-24 DIAGNOSIS — Z981 Arthrodesis status: Secondary | ICD-10-CM

## 2023-04-24 DIAGNOSIS — M545 Low back pain, unspecified: Secondary | ICD-10-CM | POA: Diagnosis not present

## 2023-05-06 ENCOUNTER — Other Ambulatory Visit (INDEPENDENT_AMBULATORY_CARE_PROVIDER_SITE_OTHER): Payer: Medicare HMO | Admitting: Neurosurgery

## 2023-05-06 DIAGNOSIS — Z981 Arthrodesis status: Secondary | ICD-10-CM

## 2023-05-06 DIAGNOSIS — M4005 Postural kyphosis, thoracolumbar region: Secondary | ICD-10-CM

## 2023-05-06 NOTE — Progress Notes (Signed)
Reviewed his images with my partner, given his multilevel prior fusion we feel that he would benefit from seeing Dr. Genelle Bal Roco's at [facility] spine surgery given his expertise in lumbar spinal deformity.  Orders being placed today.

## 2023-05-07 ENCOUNTER — Telehealth: Payer: Self-pay | Admitting: Family Medicine

## 2023-05-07 NOTE — Telephone Encounter (Signed)
Sean Kim, MD   Reviewed with Dr. Marcell Barlow, I would like to make a referral to Dr. Jory Ee at [facility] due to the complexity of his spinal deformity  I informed [name] and he will wait on call to make an appointment from Dr. Jory Ee office.

## 2023-05-14 DIAGNOSIS — M25551 Pain in right hip: Secondary | ICD-10-CM | POA: Diagnosis not present

## 2023-05-14 DIAGNOSIS — M1611 Unilateral primary osteoarthritis, right hip: Secondary | ICD-10-CM | POA: Diagnosis not present

## 2023-05-17 DIAGNOSIS — E349 Endocrine disorder, unspecified: Secondary | ICD-10-CM | POA: Diagnosis not present

## 2023-05-17 DIAGNOSIS — I10 Essential (primary) hypertension: Secondary | ICD-10-CM | POA: Diagnosis not present

## 2023-05-17 DIAGNOSIS — M48061 Spinal stenosis, lumbar region without neurogenic claudication: Secondary | ICD-10-CM | POA: Diagnosis not present

## 2023-05-17 DIAGNOSIS — I4891 Unspecified atrial fibrillation: Secondary | ICD-10-CM | POA: Diagnosis not present

## 2023-05-17 DIAGNOSIS — M1610 Unilateral primary osteoarthritis, unspecified hip: Secondary | ICD-10-CM | POA: Diagnosis not present

## 2023-05-29 ENCOUNTER — Other Ambulatory Visit
Admission: RE | Admit: 2023-05-29 | Discharge: 2023-05-29 | Disposition: A | Discharge status: Home / Self Care | Payer: Medicare HMO | Source: Ambulatory Visit | Attending: Infectious Diseases | Admitting: Infectious Diseases

## 2023-05-29 DIAGNOSIS — D649 Anemia, unspecified: Secondary | ICD-10-CM | POA: Insufficient documentation

## 2023-05-29 DIAGNOSIS — I10 Essential (primary) hypertension: Secondary | ICD-10-CM | POA: Diagnosis not present

## 2023-05-29 DIAGNOSIS — D472 Monoclonal gammopathy: Secondary | ICD-10-CM | POA: Diagnosis not present

## 2023-05-29 DIAGNOSIS — I4891 Unspecified atrial fibrillation: Secondary | ICD-10-CM | POA: Diagnosis not present

## 2023-05-29 LAB — BRAIN NATRIURETIC PEPTIDE: B Natriuretic Peptide: 165 pg/mL — ABNORMAL HIGH (ref 0.0–100.0)

## 2023-06-07 DIAGNOSIS — E349 Endocrine disorder, unspecified: Secondary | ICD-10-CM | POA: Diagnosis not present

## 2023-06-11 ENCOUNTER — Encounter: Payer: Self-pay | Admitting: Neurosurgery

## 2023-06-11 DIAGNOSIS — I10 Essential (primary) hypertension: Secondary | ICD-10-CM | POA: Diagnosis not present

## 2023-06-11 DIAGNOSIS — D649 Anemia, unspecified: Secondary | ICD-10-CM | POA: Diagnosis not present

## 2023-06-11 DIAGNOSIS — I4891 Unspecified atrial fibrillation: Secondary | ICD-10-CM | POA: Diagnosis not present

## 2023-06-11 DIAGNOSIS — M4005 Postural kyphosis, thoracolumbar region: Secondary | ICD-10-CM

## 2023-06-14 DIAGNOSIS — M4800 Spinal stenosis, site unspecified: Secondary | ICD-10-CM | POA: Diagnosis not present

## 2023-06-14 DIAGNOSIS — I10 Essential (primary) hypertension: Secondary | ICD-10-CM | POA: Diagnosis not present

## 2023-06-14 DIAGNOSIS — D649 Anemia, unspecified: Secondary | ICD-10-CM | POA: Diagnosis not present

## 2023-06-14 DIAGNOSIS — M1610 Unilateral primary osteoarthritis, unspecified hip: Secondary | ICD-10-CM | POA: Diagnosis not present

## 2023-06-14 DIAGNOSIS — R195 Other fecal abnormalities: Secondary | ICD-10-CM | POA: Diagnosis not present

## 2023-06-14 DIAGNOSIS — Z8679 Personal history of other diseases of the circulatory system: Secondary | ICD-10-CM | POA: Diagnosis not present

## 2023-06-14 DIAGNOSIS — E349 Endocrine disorder, unspecified: Secondary | ICD-10-CM | POA: Diagnosis not present

## 2023-06-14 DIAGNOSIS — E291 Testicular hypofunction: Secondary | ICD-10-CM | POA: Diagnosis not present

## 2023-06-14 DIAGNOSIS — Z862 Personal history of diseases of the blood and blood-forming organs and certain disorders involving the immune mechanism: Secondary | ICD-10-CM | POA: Diagnosis not present

## 2023-06-18 DIAGNOSIS — M4800 Spinal stenosis, site unspecified: Secondary | ICD-10-CM | POA: Diagnosis not present

## 2023-06-18 DIAGNOSIS — E669 Obesity, unspecified: Secondary | ICD-10-CM | POA: Diagnosis not present

## 2023-06-18 DIAGNOSIS — I4891 Unspecified atrial fibrillation: Secondary | ICD-10-CM | POA: Diagnosis not present

## 2023-06-18 DIAGNOSIS — I10 Essential (primary) hypertension: Secondary | ICD-10-CM | POA: Diagnosis not present

## 2023-06-18 DIAGNOSIS — E6609 Other obesity due to excess calories: Secondary | ICD-10-CM | POA: Diagnosis not present

## 2023-06-18 DIAGNOSIS — Z6837 Body mass index (BMI) 37.0-37.9, adult: Secondary | ICD-10-CM | POA: Diagnosis not present

## 2023-06-18 DIAGNOSIS — K8050 Calculus of bile duct without cholangitis or cholecystitis without obstruction: Secondary | ICD-10-CM | POA: Diagnosis not present

## 2023-06-18 DIAGNOSIS — E66812 Obesity, class 2: Secondary | ICD-10-CM | POA: Diagnosis not present

## 2023-06-18 DIAGNOSIS — G4733 Obstructive sleep apnea (adult) (pediatric): Secondary | ICD-10-CM | POA: Diagnosis not present

## 2023-06-19 ENCOUNTER — Ambulatory Visit (HOSPITAL_COMMUNITY)
Admission: RE | Admit: 2023-06-19 | Discharge: 2023-06-19 | Disposition: A | Discharge status: Home / Self Care | Payer: Medicare HMO | Source: Ambulatory Visit | Attending: Neurosurgery | Admitting: Neurosurgery

## 2023-06-19 ENCOUNTER — Ambulatory Visit (HOSPITAL_COMMUNITY)
Admission: RE | Admit: 2023-06-19 | Discharge: 2023-06-19 | Discharge status: Home / Self Care | Payer: Medicare HMO | Source: Ambulatory Visit | Attending: Neurosurgery | Admitting: Neurosurgery

## 2023-06-19 DIAGNOSIS — M4005 Postural kyphosis, thoracolumbar region: Secondary | ICD-10-CM | POA: Insufficient documentation

## 2023-06-19 DIAGNOSIS — M48061 Spinal stenosis, lumbar region without neurogenic claudication: Secondary | ICD-10-CM | POA: Diagnosis not present

## 2023-06-19 DIAGNOSIS — K449 Diaphragmatic hernia without obstruction or gangrene: Secondary | ICD-10-CM | POA: Diagnosis not present

## 2023-06-19 DIAGNOSIS — M47815 Spondylosis without myelopathy or radiculopathy, thoracolumbar region: Secondary | ICD-10-CM | POA: Diagnosis not present

## 2023-06-19 DIAGNOSIS — M5124 Other intervertebral disc displacement, thoracic region: Secondary | ICD-10-CM | POA: Diagnosis not present

## 2023-06-19 DIAGNOSIS — Z4789 Encounter for other orthopedic aftercare: Secondary | ICD-10-CM | POA: Diagnosis not present

## 2023-06-19 DIAGNOSIS — M549 Dorsalgia, unspecified: Secondary | ICD-10-CM | POA: Diagnosis not present

## 2023-06-19 DIAGNOSIS — M47816 Spondylosis without myelopathy or radiculopathy, lumbar region: Secondary | ICD-10-CM | POA: Diagnosis not present

## 2023-06-20 ENCOUNTER — Ambulatory Visit: Payer: Medicare HMO | Admitting: Nurse Practitioner

## 2023-06-20 ENCOUNTER — Encounter: Payer: Self-pay | Admitting: Nurse Practitioner

## 2023-06-20 VITALS — BP 122/72 | HR 86 | Ht 74.0 in | Wt 285.0 lb

## 2023-06-20 DIAGNOSIS — D649 Anemia, unspecified: Secondary | ICD-10-CM

## 2023-06-20 MED ORDER — OMEPRAZOLE 40 MG PO CPDR: 40.0000 mg | DELAYED_RELEASE_CAPSULE | Freq: Every day | ORAL | 1 refills | Status: DC

## 2023-06-20 NOTE — Patient Instructions (Addendum)
You have been scheduled for an endoscopy. Please follow written instructions given to you at your visit today.  If you use inhalers (even only as needed), please bring them with you on the day of your procedure. ___________________________________________________________________________ We have sent the following medications to your pharmacy for you to pick up at your convenience: Omeprazole  Defer Aspirin Recommendations to cardiology.  No Diclofenac or Ibuprofen.  Due to recent changes in healthcare laws, you may see the results of your imaging and laboratory studies on MyChart before your provider has had a chance to review them.  We understand that in some cases there may be results that are confusing or concerning to you. Not all laboratory results come back in the same time frame and the provider may be waiting for multiple results in order to interpret others.  Please give [place] 48 hours in order for your provider to thoroughly review all the results before contacting the office for clarification of your results.

## 2023-06-20 NOTE — Progress Notes (Addendum)
06/20/2023 Sean Arnold [id number] November 20, 1955   Chief Complaint: Anemia, + FOBT  History of Present Illness: Sean Arnold is a 67 year old male with a past medical history of anxiety, depression, atrial fibrillation s/p ablation x 3 and cardioversion x 27 not on AC, hypertension, OSA uses CPAP, hemochromatosis, MGUS, colon polyps, gallstones and choledocholithiasis s/p ERCP with sphincterotomy and stone extraction s/p laparoscopic cholecystectomy converted to open cholecystectomy with enterolysis/mobilization of hepatic flexure 12/18/2022 at [facility]. S/P gastric sleeve surgery 2010. Past C5-6 fusion. He is known by Dr. Barron Alvine. He presents today for further evaluation regarding anemia and heme positive stool. His baseline Hg level is 15. His Hg level dropped post operatively to 13 -> 12.7 at time of discharged 12/20/2022. Outpatient follow up labs 03/25/2023 showed a Hg level of 14.9 then Hg dropped to 13.3 on 05/17/2023 and repeat Hg 13.2, MCV 95.1, ferritin 57 and B12 278 on 05/29/2023. Hemoccult ICT home test was positive. He has nausea and vomiting if he eats fatty foods. No coffee-ground or hematemesis.No dysphagia or heartburn. No upper or lower abdominal pain. He has episodic left flank pain, describes being stabbed with a hot sharp poker which was positional and occurred when he was sitting and leaned forward about 20 degrees and went away when he stood upright which abated 2 months ago when he pushed through an episode and quickly stood up. He has lower back pain, sciatica and possible hip pain for which he took Toradol at home for 2 weeks then switched to Diclofenac 75 mg twice daily about 2 months ago and he also increased ASA 81 mg once daily to twice daily at the same time. He denies taking any oral iron or vitamin B12 supplements at this time. He has a history of hemochromatosis and last underwent phlebotomy 15 or 20 years ago. His father also had hemochromatosis. He  is currently undergoing evaluation for back pain, sciatic pain versus hip related pain.  He underwent a thoracic and lumbar spine MRI yesterday, results are pending.  He is scheduled to see spine surgeon Dr. Cyndie Mull at [facility] on 06/25/2023.      Latest Ref Rng & Units 11/02/2022    3:24 PM 10/03/2020    4:31 PM 03/09/2019    2:41 PM  CBC  WBC 4.0 - 10.5 K/uL 8.2  7.9  8.3   Hemoglobin 13.0 - 17.0 g/dL 40.9  81.1  91.4   Hematocrit 39.0 - 52.0 % 44.3  43.5  42.2   Platelets 150 - 400 K/uL 270  308.0  220        Latest Ref Rng & Units 11/02/2022    3:24 PM 10/03/2020    4:31 PM 03/09/2019    2:41 PM  CMP  Glucose 70 - 99 mg/dL 90  76  97   BUN 8 - 23 mg/dL 15  13  15    Creatinine 0.61 - 1.24 mg/dL 7.82  9.56  2.13   Sodium 135 - 145 mmol/L 137  135  136   Potassium 3.5 - 5.1 mmol/L 3.6  3.8  3.9   Chloride 98 - 111 mmol/L 101  101  102   CO2 22 - 32 mmol/L 27  28  26    Calcium 8.9 - 10.3 mg/dL 9.0  9.3  8.7   Total Protein 6.5 - 8.1 g/dL 8.0  8.2  7.7   Total Bilirubin 0.3 - 1.2 mg/dL 1.3  0.5  0.5   Alkaline Phos 38 -  126 U/L 161  62  54   AST 15 - 41 U/L 87  23  27   ALT 0 - 44 U/L 82  15  22     Addendum: Labs 05/17/2023 showed normal LFTs. T. Bili 0.9. Alk phos 72. AST 22. ALT 14.   Colonoscopy 02/14/2023: - One 3 mm polyp in the rectum, removed with a cold snare. Resected and retrieved - Otherwise, normal colonoscopy. - The distal rectum and anal verge ar - 10 year recall colonoscopy  Surgical [P], colon, rectum, polyp (1) - HYPERPLASTIC POLYP. - NO DYSPLASIA OR MALIGNANCY.  ERCP 12/17/2022: Identified stones in his common bile duct, which were extracted and an 8mm biliary sphincterotomy was performed.   Colonoscopy 06/10/2019: - Erythematous mucosa in the distal rectum. Biopsied.  - Stool in the entire examined colon.  - The visualized mucosa was otherwise normal appearing throughout the remainder of the colon. There were some areas that could not be fully visualized  due to adherent or solid stool debris, and therefore cannot fully rule out the presence of small or flat polyps in these areas, particularly in the right colon.  - The distal rectum and anal verge are normal on retroflexion view.  - The examined portion of the ileum was normal.  Colonoscopy 09/08/2012 by Dr. Saunders Revel: Ulcer in the distal rectum Polyp in the distal rectum Ulceration, granularity and friability in the rectum Normal mucosa in the descending colon Stool in distal transverse colon Stool in distal ascending colon Stool in the cecum   Past Medical History:  Diagnosis Date   A-fib [facility])    Anxiety    Arthritis    Brachial plexopathy 11/27/2018   Left    Complication of anesthesia    Depression    Dysrhythmia    a-Fib    ablation   Hypertension    MGUS (monoclonal gammopathy of unknown significance) 04/02/2019   IgG    Neuromuscular disorder (HCC)    neuropathy  both legs and feet   Peripheral neuropathy 04/02/2019   PONV (postoperative nausea and vomiting)    Severe  nausea   Sleep apnea    CPAP    Past Surgical History:  Procedure Laterality Date   ablation for atrial fib     ACHILLES TENDON REPAIR     arthroscopic knee Bilateral    BACK SURGERY     times four   CERVICAL FUSION     CHOLECYSTECTOMY     LAMINECTOMY     LAPAROSCOPIC GASTRIC SLEEVE RESECTION     TOTAL KNEE ARTHROPLASTY Left 01/27/2018   Procedure: LEFT TOTAL KNEE ARTHROPLASTY;  Surgeon: Dannielle Huh, MD;  Location: [facility] OR;  Service: Orthopedics;  Laterality: Left;   Current Outpatient Medications on File Prior to Visit  Medication Sig Dispense Refill   carvedilol (COREG) 12.5 MG tablet Take 12.5 mg by mouth 2 (two) times daily.     cloNIDine (CATAPRES) 0.1 MG tablet Take 0.1 mg by mouth 2 (two) times daily.     diazepam (VALIUM) 10 MG tablet Take 10 mg by mouth every 6 (six) hours as needed for anxiety.     furosemide (LASIX) 20 MG tablet Take 20 mg by mouth daily.     hydrochlorothiazide  (HYDRODIURIL) 12.5 MG tablet Take 12.5 mg by mouth daily.     lisinopril (ZESTRIL) 40 MG tablet Take 40 mg by mouth daily.     Magnesium 300 MG CAPS Take 1 capsule by mouth daily as needed.  Multiple Vitamin (MULTIVITAMIN) tablet Take 1 tablet by mouth daily.     Potassium 99 MG TABS Take 1 tablet by mouth daily as needed.     SODIUM FLUORIDE 5000 PPM 1.1 % PSTE SMARTSIG:Sparingly By Mouth Daily     traMADol (ULTRAM) 50 MG tablet Take 50 mg by mouth every 6 (six) hours as needed.     No current facility-administered medications on file prior to visit.   Allergies  Allergen Reactions   Dofetilide Shortness Of Breath   Morphine And Codeine Anaphylaxis and Shortness Of Breath    Breathing Distress (patient states it was only because of large dose)   Codeine Other (See Comments)    headache   Haemophilus Influenzae Vaccines Other (See Comments)    Severe reaction to both arms   Influenza Vac Split Quad Other (See Comments)    Severe reaction to both arms   Current Medications, Allergies, Past Medical History, Past Surgical History, Family History and Social History were reviewed in [employer] record.  Review of Systems:   Constitutional: Negative for fever, sweats, chills or weight loss.  Respiratory: Negative for shortness of breath.   Cardiovascular: Negative for chest pain, palpitations and leg swelling.  Gastrointestinal: See HPI.  Musculoskeletal: Negative for back pain or muscle aches.  Neurological: Negative for dizziness, headaches or paresthesias.   Physical Exam: BP 122/72   Pulse 86   Ht 6\' 2"  (1.88 m)   Wt 285 lb (129.3 kg)   SpO2 95%   BMI 36.59 kg/m   General: 67 year old male in no acute distress. Head: Normocephalic and atraumatic. Eyes: No scleral icterus. Conjunctiva pink . Ears: Normal auditory acuity. Mouth: Dentition intact. No ulcers or lesions.  Lungs: Clear throughout to auscultation. Heart: Regular rate and rhythm, no  murmur. Abdomen: Soft, nontender and nondistended. Large RUQ scar intact. No masses or hepatomegaly. Normal bowel sounds x 4 quadrants.  Rectal: Deferred. Musculoskeletal: Symmetrical with no gross deformities. Extremities: No edema. Neurological: Alert oriented x 4.No focal deficits.  Psychological: Alert and cooperative. Normal mood and affect  Assessment and Recommendations:  67 year old male with anemia and hemoccult positive stool in setting of NSAID use. Hg 13.2 with baseline Hg level 15. B12 deficient.  Nausea and vomiting occurs if he eats fatty foods. Prior left flank pain abated 2 months ago. -No Diclofenac/Ibuprofen/Toradol -Defer ASA recommendations to patient's cardiologist -Omeprazole 40 mg 1 capsule p.o. daily to be taken 30 minutes before breakfast -EGD to rule out PUD/atrophic gastritis benefits and risks discussed including risk with sedation, risk of bleeding, perforation and infection  -CBC, iron, TIBC and ferritin level -Defer vitamin B12 replacement therapy to PCP  History of gallstones and choledocholithiasis s/p ERCP with sphincterotomy and stone extraction s/p laparoscopic cholecystectomy converted to open cholecystectomy 12/2022  Past gastric sleeve surgery 2010  History of 1 hyperplastic rectal polyp per colonoscopy 02/2023 -Next colonoscopy due 02/2033  History of MGUS  History of hemochromatosis, last phlebotomy was 15 to 20 years ago.  Ferritin level 57 on 05/17/2023.  History of atrial fibrillation s/p ablation x 3 and cardioversion x 27 not on AC.  Patient recently stopped ASA.  Normal rhythm on exam today.  No angina. -Patient instructed to follow-up with his cardiologist regarding ASA recommendations  Chronic back/hip pain, thoracic/lumbar MRI completed 06/19/2023, results pending

## 2023-06-21 ENCOUNTER — Other Ambulatory Visit: Payer: Medicare HMO

## 2023-06-21 DIAGNOSIS — D649 Anemia, unspecified: Secondary | ICD-10-CM | POA: Diagnosis not present

## 2023-06-21 LAB — CBC
HCT: 44 % (ref 39.0–52.0)
Hemoglobin: 14.8 g/dL (ref 13.0–17.0)
MCHC: 33.6 g/dL (ref 30.0–36.0)
MCV: 93.5 fL (ref 78.0–100.0)
Platelets: 298 10*3/uL (ref 150.0–400.0)
RBC: 4.71 Mil/uL (ref 4.22–5.81)
RDW: 13 % (ref 11.5–15.5)
WBC: 9.3 10*3/uL (ref 4.0–10.5)

## 2023-06-21 LAB — IBC + FERRITIN
Ferritin: 57.9 ng/mL (ref 22.0–322.0)
Iron: 40 ug/dL — ABNORMAL LOW (ref 42–165)
Saturation Ratios: 14 % — ABNORMAL LOW (ref 20.0–50.0)
TIBC: 285.6 ug/dL (ref 250.0–450.0)
Transferrin: 204 mg/dL — ABNORMAL LOW (ref 212.0–360.0)

## 2023-06-25 DIAGNOSIS — M5416 Radiculopathy, lumbar region: Secondary | ICD-10-CM | POA: Diagnosis not present

## 2023-06-25 DIAGNOSIS — M4726 Other spondylosis with radiculopathy, lumbar region: Secondary | ICD-10-CM | POA: Diagnosis not present

## 2023-06-25 DIAGNOSIS — Z981 Arthrodesis status: Secondary | ICD-10-CM | POA: Diagnosis not present

## 2023-07-03 DIAGNOSIS — E230 Hypopituitarism: Secondary | ICD-10-CM | POA: Diagnosis not present

## 2023-07-04 NOTE — Progress Notes (Signed)
Agree with the assessment and plan as outlined by Colleen Kennedy-Smith, NP.   Trask Vosler, DO, FACG [facility]   

## 2023-07-08 DIAGNOSIS — M4030 Flatback syndrome, site unspecified: Secondary | ICD-10-CM | POA: Diagnosis not present

## 2023-07-08 DIAGNOSIS — M4050 Lordosis, unspecified, site unspecified: Secondary | ICD-10-CM | POA: Diagnosis not present

## 2023-07-08 DIAGNOSIS — M419 Scoliosis, unspecified: Secondary | ICD-10-CM | POA: Diagnosis not present

## 2023-07-15 ENCOUNTER — Encounter: Payer: Self-pay | Admitting: Gastroenterology

## 2023-07-15 ENCOUNTER — Other Ambulatory Visit: Payer: Self-pay | Admitting: Nurse Practitioner

## 2023-07-19 DIAGNOSIS — D472 Monoclonal gammopathy: Secondary | ICD-10-CM | POA: Diagnosis not present

## 2023-07-19 DIAGNOSIS — G608 Other hereditary and idiopathic neuropathies: Secondary | ICD-10-CM | POA: Diagnosis not present

## 2023-07-19 DIAGNOSIS — G8929 Other chronic pain: Secondary | ICD-10-CM | POA: Diagnosis not present

## 2023-07-19 DIAGNOSIS — E349 Endocrine disorder, unspecified: Secondary | ICD-10-CM | POA: Diagnosis not present

## 2023-07-19 DIAGNOSIS — M25551 Pain in right hip: Secondary | ICD-10-CM | POA: Diagnosis not present

## 2023-07-22 DIAGNOSIS — Z981 Arthrodesis status: Secondary | ICD-10-CM | POA: Diagnosis not present

## 2023-07-22 DIAGNOSIS — M47816 Spondylosis without myelopathy or radiculopathy, lumbar region: Secondary | ICD-10-CM | POA: Diagnosis not present

## 2023-07-24 ENCOUNTER — Ambulatory Visit: Payer: Medicare HMO | Admitting: Gastroenterology

## 2023-07-24 ENCOUNTER — Encounter: Payer: Self-pay | Admitting: Gastroenterology

## 2023-07-24 VITALS — BP 127/74 | HR 70 | Temp 98.4°F | Resp 12 | Ht 74.0 in | Wt 285.0 lb

## 2023-07-24 DIAGNOSIS — Z903 Acquired absence of stomach [part of]: Secondary | ICD-10-CM

## 2023-07-24 DIAGNOSIS — E538 Deficiency of other specified B group vitamins: Secondary | ICD-10-CM | POA: Diagnosis not present

## 2023-07-24 DIAGNOSIS — D509 Iron deficiency anemia, unspecified: Secondary | ICD-10-CM | POA: Diagnosis not present

## 2023-07-24 DIAGNOSIS — I10 Essential (primary) hypertension: Secondary | ICD-10-CM | POA: Diagnosis not present

## 2023-07-24 DIAGNOSIS — F32A Depression, unspecified: Secondary | ICD-10-CM | POA: Diagnosis not present

## 2023-07-24 DIAGNOSIS — K319 Disease of stomach and duodenum, unspecified: Secondary | ICD-10-CM | POA: Diagnosis not present

## 2023-07-24 DIAGNOSIS — I4891 Unspecified atrial fibrillation: Secondary | ICD-10-CM | POA: Diagnosis not present

## 2023-07-24 DIAGNOSIS — G4733 Obstructive sleep apnea (adult) (pediatric): Secondary | ICD-10-CM | POA: Diagnosis not present

## 2023-07-24 DIAGNOSIS — D649 Anemia, unspecified: Secondary | ICD-10-CM

## 2023-07-24 MED ORDER — SODIUM CHLORIDE 0.9 % IV SOLN: 500.0000 mL | Freq: Once | INTRAVENOUS | Status: DC

## 2023-07-24 NOTE — Progress Notes (Signed)
GASTROENTEROLOGY PROCEDURE H&P NOTE   Primary Care Physician: Gayla Doss, MD    Reason for Procedure:  Anemia, heme positive stool, B12 deficiency, iron deficiency, NSAID exposure  Plan:    EGD  Patient is appropriate for endoscopic procedure(s) in the ambulatory ([facility]) setting.  The nature of the procedure, as well as the risks, benefits, and alternatives were carefully and thoroughly reviewed with the patient. Ample time for discussion and questions allowed. The patient understood, was satisfied, and agreed to proceed.     HPI: Sean Arnold is a 67 y.o. male who presents for EGD for evaluation of anemia, FOBT positive stool, B12 deficiency, iron deficiency.  Had been taking NSAIDs which she stopped last month.  Was started on omeprazole 40 mg daily last month and presents today for EGD.  Does have a history of gastric sleeve in 2010.  Colonoscopy completed earlier this year.  Holding iron repletion due to history of hemochromatosis.  B12 responded to supplementation.  Past Medical History:  Diagnosis Date   A-fib [facility])    Anxiety    Arthritis    Brachial plexopathy 11/27/2018   Left    Complication of anesthesia    Depression    Dysrhythmia    a-Fib    ablation   Hypertension    MGUS (monoclonal gammopathy of unknown significance) 04/02/2019   IgG    Neuromuscular disorder (HCC)    neuropathy  both legs and feet   Peripheral neuropathy 04/02/2019   PONV (postoperative nausea and vomiting)    Severe  nausea   Sleep apnea    CPAP     Past Surgical History:  Procedure Laterality Date   ablation for atrial fib     x3   ACHILLES TENDON REPAIR     arthroscopic knee Bilateral    BACK SURGERY     times four   CERVICAL FUSION     CHOLECYSTECTOMY     COLONOSCOPY     LAMINECTOMY     LAPAROSCOPIC GASTRIC SLEEVE RESECTION     TOTAL KNEE ARTHROPLASTY Left 01/27/2018   Procedure: LEFT TOTAL KNEE ARTHROPLASTY;  Surgeon: Dannielle Huh, MD;  Location: [facility] OR;   Service: Orthopedics;  Laterality: Left;    Prior to Admission medications   Medication Sig Start Date End Date Taking? Authorizing Provider  aspirin EC 81 MG tablet Take 81 mg by mouth daily. Swallow whole.   Yes [provider]  carvedilol (COREG) 12.5 MG tablet Take 12.5 mg by mouth 2 (two) times daily. 07/17/21  Yes [provider]  cloNIDine (CATAPRES) 0.1 MG tablet Take 0.1 mg by mouth 2 (two) times daily. 06/18/23 06/17/24 Yes [provider]  diazepam (VALIUM) 10 MG tablet Take 10 mg by mouth every 6 (six) hours as needed for anxiety.   Yes [provider]  diclofenac (VOLTAREN) 75 MG EC tablet Take 75 mg by mouth daily.   Yes [provider]  furosemide (LASIX) 20 MG tablet Take 20 mg by mouth daily. 07/17/21  Yes [provider]  hydrochlorothiazide (HYDRODIURIL) 12.5 MG tablet Take 12.5 mg by mouth daily. 06/18/23 06/17/24 Yes [provider]  lisinopril (ZESTRIL) 40 MG tablet Take 40 mg by mouth daily.   Yes [provider]  Magnesium 300 MG CAPS Take 1 capsule by mouth daily as needed.   Yes [provider]  Multiple Vitamin (MULTIVITAMIN) tablet Take 1 tablet by mouth daily.   Yes [provider]  omeprazole (PRILOSEC) 40 MG capsule  TAKE 1 CAPSULE BY MOUTH DAILY 30 MINUTES BEFORE BREAKFAST 07/15/23  Yes Arnaldo Natal, NP  Potassium 99 MG TABS Take 1 tablet by mouth daily as needed.   Yes [provider]  SODIUM FLUORIDE 5000 PPM 1.1 % PSTE SMARTSIG:Sparingly By Mouth Daily 07/19/21  Yes [provider]  traMADol (ULTRAM) 50 MG tablet Take 50 mg by mouth every 6 (six) hours as needed.   Yes [provider]    Current Outpatient Medications  Medication Sig Dispense Refill   aspirin EC 81 MG tablet Take 81 mg by mouth daily. Swallow whole.     carvedilol (COREG) 12.5 MG tablet Take 12.5 mg by mouth 2 (two) times daily.     cloNIDine (CATAPRES) 0.1 MG tablet Take  0.1 mg by mouth 2 (two) times daily.     diazepam (VALIUM) 10 MG tablet Take 10 mg by mouth every 6 (six) hours as needed for anxiety.     diclofenac (VOLTAREN) 75 MG EC tablet Take 75 mg by mouth daily.     furosemide (LASIX) 20 MG tablet Take 20 mg by mouth daily.     hydrochlorothiazide (HYDRODIURIL) 12.5 MG tablet Take 12.5 mg by mouth daily.     lisinopril (ZESTRIL) 40 MG tablet Take 40 mg by mouth daily.     Magnesium 300 MG CAPS Take 1 capsule by mouth daily as needed.     Multiple Vitamin (MULTIVITAMIN) tablet Take 1 tablet by mouth daily.     omeprazole (PRILOSEC) 40 MG capsule TAKE 1 CAPSULE BY MOUTH DAILY 30 MINUTES BEFORE BREAKFAST 90 capsule 1   Potassium 99 MG TABS Take 1 tablet by mouth daily as needed.     SODIUM FLUORIDE 5000 PPM 1.1 % PSTE SMARTSIG:Sparingly By Mouth Daily     traMADol (ULTRAM) 50 MG tablet Take 50 mg by mouth every 6 (six) hours as needed.     Current Facility-Administered Medications  Medication Dose Route Frequency Provider Last Rate Last Admin   0.9 %  sodium chloride infusion  500 mL Intravenous Once Azzie Thiem V, DO        Allergies as of 07/24/2023 - Review Complete 07/24/2023  Allergen Reaction Noted   Dofetilide Shortness Of Breath 08/21/2006   Morphine and codeine Anaphylaxis and Shortness Of Breath 05/22/2012   Codeine Other (See Comments) 03/10/2021   Haemophilus influenzae vaccines Other (See Comments) 10/03/2020   Influenza vac split quad Other (See Comments) 03/05/2019    Family History  Problem Relation Age of Onset   GI problems Mother        "bad reflux" so much so it "dissolved epiglottis"   Arthritis Mother    Depression Mother    Pneumonia Father    Atrial fibrillation Father    Alcohol abuse Father    Arthritis Father    Depression Father    Depression Sister    Arthritis Sister    Arthritis Brother    Depression Brother    Hyperlipidemia Brother    Hypertension Brother    Colon cancer Maternal Uncle     Esophageal cancer Neg Hx    Stomach cancer Neg Hx    Rectal cancer Neg Hx    Pancreatic cancer Neg Hx     Social History   Socioeconomic History   Marital status: Divorced    Spouse name: Not on file   Number of children: 2   Years of education: Not on file   Highest education level: Not on file  Occupational History   Not on file  Tobacco Use   Smoking status: Former    Types: Cigarettes   Smokeless tobacco: Never  Vaping Use   Vaping status: Never Used  Substance and Sexual Activity   Alcohol use: Yes    Comment: rarely   Drug use: Never   Sexual activity: Never  Other Topics Concern   Not on file  Social History Narrative   Right handed    Caffeine 5-6 cups daily   Lives at home with dog max    Social Determinants of Health   Financial Resource Strain: High Risk (06/20/2023)   Received from [facility]   Overall Financial Resource Strain (CARDIA)    Difficulty of Paying Living Expenses: Hard  Food Insecurity: Food Insecurity Present (06/20/2023)   Received from [facility]   Hunger Vital Sign    Worried About Running Out of Food in the Last Year: Never true    Ran Out of Food in the Last Year: Sometimes true  Transportation Needs: Unmet Transportation Needs (06/20/2023)   Received from [facility]   PRAPARE - Transportation    In the past 12 months, has lack of transportation kept you from medical appointments or from getting medications?: No    Lack of Transportation (Non-Medical): Yes  Physical Activity: Not on file  Stress: Not on file  Social Connections: Not on file  Intimate Partner Violence: Not on file    Physical Exam: Vital signs in last 24 hours: @BP  (!) 147/97   Pulse (!) 55   Temp 98.4 F (36.9 C) (Skin)   Ht 6\' 2"  (1.88 m)   Wt 285 lb (129.3 kg)   SpO2 98%   BMI 36.59 kg/m  GEN: NAD EYE: Sclerae anicteric ENT: MMM CV: Non-tachycardic Pulm: CTA b/l GI: Soft, NT/ND NEURO:   Alert & Oriented x 3   Doristine Locks, DO [facility]   07/24/2023 11:40 AM

## 2023-07-24 NOTE — Progress Notes (Signed)
Called to room to assist during endoscopic procedure.  Patient ID and intended procedure confirmed with present staff. Received instructions for my participation in the procedure from the performing physician.  

## 2023-07-24 NOTE — Op Note (Signed)
[facility] Patient Name: Sean Arnold Procedure Date: 07/24/2023 11:45 AM MRN: [id number] Endoscopist: Doristine Locks , MD, [id number] Age: 67 Referring MD:  Date of Birth: [birth year] Gender: Male Account #: [id number] Procedure:                Upper GI endoscopy Indications:              Iron deficiency, B12 deficiency, mild anemia, Heme                            positive stool Medicines:                Monitored Anesthesia Care Procedure:                Pre-Anesthesia Assessment:                           - Prior to the procedure, a History and Physical                            was performed, and patient medications and                            allergies were reviewed. The patient's tolerance of                            previous anesthesia was also reviewed. The risks                            and benefits of the procedure and the sedation                            options and risks were discussed with the patient.                            All questions were answered, and informed consent                            was obtained. Prior Anticoagulants: The patient has                            taken no anticoagulant or antiplatelet agents. ASA                            Grade Assessment: III - A patient with severe                            systemic disease. After reviewing the risks and                            benefits, the patient was deemed in satisfactory                            condition to undergo the procedure.  After obtaining informed consent, the endoscope was                            passed under direct vision. Throughout the                            procedure, the patient's blood pressure, pulse, and                            oxygen saturations were monitored continuously. The                            GIF [id number] #[id number] was introduced through the                            mouth, and advanced to the third part  of duodenum.                            The upper GI endoscopy was accomplished without                            difficulty. The patient tolerated the procedure                            well. Scope In: Scope Out: Findings:                 The examined esophagus was normal.                           The Z-line was regular and was found 40 cm from the                            incisors.                           Evidence of a vertical banded gastroplasty was                            found in the gastric fundus and in the gastric                            body. This was characterized by healthy appearing                            mucosa.                           Normal mucosa was found in the entire examined                            stomach. Biopsies were taken with a cold forceps                            for Helicobacter pylori testing. Estimated blood  loss was minimal.                           The examined duodenum was normal. Biopsies were                            taken with a cold forceps for histology. Estimated                            blood loss was minimal. Complications:            No immediate complications. Estimated Blood Loss:     Estimated blood loss was minimal. Impression:               - Normal esophagus.                           - Z-line regular, 40 cm from the incisors.                           - A vertical banded gastroplasty was found,                            characterized by healthy appearing mucosa.                           - Normal mucosa was found in the entire stomach.                            Biopsied.                           - Normal examined duodenum. Biopsied. Recommendation:           - Patient has a contact number available for                            emergencies. The signs and symptoms of potential                            delayed complications were discussed with the                             patient. Return to normal activities tomorrow.                            Written discharge instructions were provided to the                            patient.                           - Resume previous diet.                           - Continue present medications.                           -  Await pathology results.                           - Return to GI clinic PRN. Doristine Locks, MD 07/24/2023 12:09:51 PM

## 2023-07-24 NOTE — Patient Instructions (Signed)

## 2023-07-24 NOTE — Progress Notes (Signed)
Report to PACU, RN, vss, BBS= Clear.  

## 2023-07-25 ENCOUNTER — Telehealth: Payer: Self-pay

## 2023-07-25 NOTE — Telephone Encounter (Signed)
Follow up call placed, VM obtained and message left. 

## 2023-07-26 LAB — SURGICAL PATHOLOGY

## 2023-07-30 DIAGNOSIS — M4050 Lordosis, unspecified, site unspecified: Secondary | ICD-10-CM | POA: Diagnosis not present

## 2023-07-30 DIAGNOSIS — M4030 Flatback syndrome, site unspecified: Secondary | ICD-10-CM | POA: Diagnosis not present

## 2023-07-31 ENCOUNTER — Ambulatory Visit: Payer: Medicare HMO | Admitting: Neurosurgery

## 2023-08-02 DIAGNOSIS — H5203 Hypermetropia, bilateral: Secondary | ICD-10-CM | POA: Diagnosis not present

## 2023-08-02 DIAGNOSIS — Z0100 Encounter for examination of eyes and vision without abnormal findings: Secondary | ICD-10-CM | POA: Diagnosis not present

## 2023-09-27 NOTE — Progress Notes (Signed)
Sent message, via epic in basket, requesting orders in epic from surgeon.  

## 2023-09-30 ENCOUNTER — Ambulatory Visit: Payer: Self-pay | Admitting: Student

## 2023-09-30 NOTE — H&P (Signed)
TOTAL HIP ADMISSION H&P  Patient is admitted for right total hip arthroplasty.  Subjective:  Chief Complaint: right hip pain  HPI: Sean Arnold, 68 y.o. male, has a history of pain and functional disability in the right hip(s) due to arthritis and patient has failed non-surgical conservative treatments for greater than 12 weeks to include NSAID's and/or analgesics, flexibility and strengthening excercises, use of assistive devices, and activity modification.  Onset of symptoms was gradual starting 10 years ago with rapidlly worsening course since that time.The patient noted no past surgery on the right hip(s).  Patient currently rates pain in the right hip at 10 out of 10 with activity. Patient has night pain, worsening of pain with activity and weight bearing, trendelenberg gait, pain that interfers with activities of daily living, and pain with passive range of motion. Patient has evidence of subchondral cysts, subchondral sclerosis, periarticular osteophytes, and joint space narrowing by imaging studies. This condition presents safety issues increasing the risk of falls.  There is no current active infection.  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   Chronic bilateral low back pain without sciatica 04/24/2023   Pain of right hip 04/24/2023   S/P lumbar spinal fusion 04/24/2023   Polyneuropathy, unspecified 04/24/2023   Encounter for medical examination to establish care 07/31/2021   Primary insomnia 07/31/2021   Bilateral leg edema 07/31/2021   Positive depression screening 07/31/2021   Lumbar radiculopathy 04/11/2021   Morbid obesity (HCC) 04/11/2021   Nicotine dependence, other tobacco product, uncomplicated 04/11/2021   Therapeutic opioid induced constipation 10/05/2020   Opioid dependence (HCC) 10/05/2020   Tobacco abuse 10/05/2020   Hx of atrial fibrillation without current medication 10/05/2020   Hereditary and  idiopathic peripheral neuropathy 04/02/2019   MGUS (monoclonal gammopathy of unknown significance) 04/02/2019   Brachial plexopathy 11/27/2018   Pain in joint of right shoulder 05/07/2018   Shoulder impingement, right 03/18/2018   S/P total knee replacement 01/27/2018   Chronic pain of both knees 12/05/2017   Chronic pain syndrome 01/10/2017   On long term drug therapy 04/09/2016   Cervical stenosis of spine 12/23/2013   Persistent atrial fibrillation (HCC) 06/09/2012   Spinal stenosis 05/22/2012   Essential hypertension 05/22/2012   Hemochromatosis 05/22/2012   OSA on CPAP 05/22/2012   Past Medical History:  Diagnosis Date   A-fib [facility])    Anxiety    Arthritis    Brachial plexopathy 11/27/2018   Left    Complication of anesthesia    Depression    Dysrhythmia    a-Fib    ablation   Hypertension    MGUS (monoclonal gammopathy of unknown significance) 04/02/2019   IgG    Neuromuscular disorder (HCC)    neuropathy  both legs and feet   Peripheral neuropathy 04/02/2019   PONV (postoperative nausea and vomiting)    Severe  nausea   Sleep apnea    CPAP     Past Surgical History:  Procedure Laterality Date   ablation for atrial fib     x3   ACHILLES TENDON REPAIR     arthroscopic knee Bilateral    BACK SURGERY     times four   CERVICAL FUSION     CHOLECYSTECTOMY     COLONOSCOPY     LAMINECTOMY     LAPAROSCOPIC GASTRIC SLEEVE RESECTION     TOTAL KNEE ARTHROPLASTY Left 01/27/2018   Procedure: LEFT TOTAL KNEE ARTHROPLASTY;  Surgeon: Dannielle Huh, MD;  Location: [facility] OR;  Service: Orthopedics;  Laterality: Left;    Current Outpatient Medications  Medication Sig Dispense Refill Last Dose/Taking   aspirin EC 81 MG tablet Take 81 mg by mouth daily. Swallow whole.      carvedilol (COREG) 12.5 MG tablet Take 12.5 mg by mouth 2 (two) times daily.      cloNIDine (CATAPRES) 0.1 MG tablet Take 0.1 mg by mouth 2 (two) times daily.      diazepam (VALIUM) 10 MG tablet Take 10 mg by  mouth every 6 (six) hours as needed for anxiety.      diclofenac (VOLTAREN) 75 MG EC tablet Take 75 mg by mouth daily.      furosemide (LASIX) 20 MG tablet Take 20 mg by mouth daily.      hydrochlorothiazide (HYDRODIURIL) 12.5 MG tablet Take 12.5 mg by mouth daily.      lisinopril (ZESTRIL) 40 MG tablet Take 40 mg by mouth daily.      Magnesium 300 MG CAPS Take 1 capsule by mouth daily as needed.      Multiple Vitamin (MULTIVITAMIN) tablet Take 1 tablet by mouth daily.      omeprazole (PRILOSEC) 40 MG capsule TAKE 1 CAPSULE BY MOUTH DAILY 30 MINUTES BEFORE BREAKFAST 90 capsule 1    Potassium 99 MG TABS Take 1 tablet by mouth daily as needed.      SODIUM FLUORIDE 5000 PPM 1.1 % PSTE SMARTSIG:Sparingly By Mouth Daily      traMADol (ULTRAM) 50 MG tablet Take 50 mg by mouth every 6 (six) hours as needed.      No current facility-administered medications for this visit.   Allergies  Allergen Reactions   Dofetilide Shortness Of Breath   Morphine And Codeine Anaphylaxis and Shortness Of Breath    Breathing Distress (patient states it was only because of large dose)   Codeine Other (See Comments)    Headache Pt denies allergy   Haemophilus Influenzae Vaccines Other (See Comments)    Severe reaction to both arms   Influenza Vac Split Quad Other (See Comments)    Severe reaction to both arms    Social History   Tobacco Use   Smoking status: Former    Types: Cigarettes   Smokeless tobacco: Never  Substance Use Topics   Alcohol use: Yes    Comment: rarely    Family History  Problem Relation Age of Onset   GI problems Mother        "bad reflux" so much so it "dissolved epiglottis"   Arthritis Mother    Depression Mother    Pneumonia Father    Atrial fibrillation Father    Alcohol abuse Father    Arthritis Father    Depression Father    Depression Sister    Arthritis Sister    Arthritis Brother    Depression Brother    Hyperlipidemia Brother    Hypertension Brother    Colon  cancer Maternal Uncle    Esophageal cancer Neg Hx    Stomach cancer Neg Hx    Rectal cancer Neg Hx    Pancreatic cancer Neg Hx      Review of Systems  Musculoskeletal:  Positive for arthralgias and gait problem.  All other systems reviewed and are negative.   Objective:  Physical Exam Constitutional:      Appearance: Normal appearance.  HENT:     Head: Normocephalic and atraumatic.     Nose: Nose normal.     Mouth/Throat:  Mouth: Mucous membranes are moist.     Pharynx: Oropharynx is clear.  Eyes:     Conjunctiva/sclera: Conjunctivae normal.  Cardiovascular:     Rate and Rhythm: Normal rate and regular rhythm.     Pulses: Normal pulses.     Heart sounds: Normal heart sounds.  Pulmonary:     Effort: Pulmonary effort is normal.     Breath sounds: Normal breath sounds.  Abdominal:     General: Abdomen is flat.     Palpations: Abdomen is soft.  Genitourinary:    Comments: deferred Musculoskeletal:     Cervical back: Normal range of motion and neck supple.     Comments: Examination of the right hip reveals no skin wounds or lesions. Mild trochanteric tenderness to palpation. Severely restricted range of motion of the right hip. Significant flexion contracture, greater than 30 degrees. Pain with terminal flexion and rotation. Pain in the position of impingement. Positive Stinchfield.  Patient ambulates with a markedly antalgic gait.  Neurovascular intact distally.  Skin:    General: Skin is warm and dry.     Capillary Refill: Capillary refill takes less than 2 seconds.  Neurological:     General: No focal deficit present.     Mental Status: He is alert and oriented to person, place, and time.  Psychiatric:        Mood and Affect: Mood normal.        Behavior: Behavior normal.        Thought Content: Thought content normal.        Judgment: Judgment normal.     Vital signs in last 24 hours: @VSRANGES @  Labs:   Estimated body mass index is 36.59 kg/m as  calculated from the following:   Height as of 07/24/23: 6\' 2"  (1.88 m).   Weight as of 07/24/23: 129.3 kg.   Imaging Review Plain radiographs demonstrate severe degenerative joint disease of the right hip(s). The bone quality appears to be adequate for age and reported activity level.      Assessment/Plan:  End stage arthritis, right hip(s)  The patient history, physical examination, clinical judgement of the provider and imaging studies are consistent with end stage degenerative joint disease of the right hip(s) and total hip arthroplasty is deemed medically necessary. The treatment options including medical management, injection therapy, arthroscopy and arthroplasty were discussed at length. The risks and benefits of total hip arthroplasty were presented and reviewed. The risks due to aseptic loosening, infection, stiffness, dislocation/subluxation,  thromboembolic complications and other imponderables were discussed.  The patient acknowledged the explanation, agreed to proceed with the plan and consent was signed. Patient is being admitted for inpatient treatment for surgery, pain control, PT, OT, prophylactic antibiotics, VTE prophylaxis, progressive ambulation and ADL's and discharge planning.The patient is planning to be discharged home with HEP after an overnight stay.   Therapy Plans: HEP. Disposition: Home with daughter Planned DVT Prophylaxis: aspirin 81mg  BID DME needed: Has rolling walker.  PCP: Cleared.  Cardiology: Cleared. Okay to hold aspirin 5 days prior to surgery.  TXA: IV Allergies:  - NDKA.  Anesthesia Concerns: None.  BMI: 39.2 Last HgbA1c: Not diabetic Other: - Aspirin 81mg  baseline.  - A-fib, s/p ablation.  - History back surgery x5.  - Tramadol at baseline once at bedtime.  - Hydrocodone, zofran, has diclofenac.  - Labs pending.    Patient's anticipated LOS is less than 2 midnights, meeting these requirements: - Younger than 14 - Lives within 1 hour  of  care - Has a competent adult at home to recover with post-op recover - NO history of  - Chronic pain requiring opiods  - Diabetes  - Coronary Artery Disease  - Heart failure  - Heart attack  - Stroke  - DVT/VTE  - Cardiac arrhythmia  - Respiratory Failure/COPD  - Renal failure  - Anemia  - Advanced Liver disease

## 2023-10-04 ENCOUNTER — Encounter (HOSPITAL_COMMUNITY)
Admission: RE | Admit: 2023-10-04 | Discharge: 2023-10-04 | Disposition: A | Discharge status: Home / Self Care | Payer: Medicare HMO | Source: Ambulatory Visit | Attending: Cardiovascular Disease | Admitting: Cardiovascular Disease

## 2023-10-08 DIAGNOSIS — G5732 Lesion of lateral popliteal nerve, left lower limb: Secondary | ICD-10-CM | POA: Diagnosis not present

## 2023-10-08 DIAGNOSIS — G603 Idiopathic progressive neuropathy: Secondary | ICD-10-CM | POA: Diagnosis not present

## 2023-10-08 DIAGNOSIS — G609 Hereditary and idiopathic neuropathy, unspecified: Secondary | ICD-10-CM | POA: Diagnosis not present

## 2023-10-08 DIAGNOSIS — G5731 Lesion of lateral popliteal nerve, right lower limb: Secondary | ICD-10-CM | POA: Diagnosis not present

## 2023-10-08 DIAGNOSIS — G608 Other hereditary and idiopathic neuropathies: Secondary | ICD-10-CM | POA: Diagnosis not present

## 2023-10-08 DIAGNOSIS — G5602 Carpal tunnel syndrome, left upper limb: Secondary | ICD-10-CM | POA: Diagnosis not present

## 2023-10-08 DIAGNOSIS — R94131 Abnormal electromyogram [EMG]: Secondary | ICD-10-CM | POA: Diagnosis not present

## 2023-10-10 ENCOUNTER — Ambulatory Visit (HOSPITAL_COMMUNITY): Admit: 2023-10-10 | Payer: Self-pay | Admitting: Orthopedic Surgery

## 2023-10-10 SURGERY — ARTHROPLASTY, HIP, TOTAL, ANTERIOR APPROACH
Anesthesia: Spinal | Site: Hip | Laterality: Right

## 2023-10-21 DIAGNOSIS — R94131 Abnormal electromyogram [EMG]: Secondary | ICD-10-CM | POA: Diagnosis not present

## 2023-10-21 DIAGNOSIS — G608 Other hereditary and idiopathic neuropathies: Secondary | ICD-10-CM | POA: Diagnosis not present

## 2023-10-21 DIAGNOSIS — E669 Obesity, unspecified: Secondary | ICD-10-CM | POA: Diagnosis not present

## 2023-10-21 DIAGNOSIS — R635 Abnormal weight gain: Secondary | ICD-10-CM | POA: Diagnosis not present

## 2023-10-21 DIAGNOSIS — D472 Monoclonal gammopathy: Secondary | ICD-10-CM | POA: Diagnosis not present

## 2023-10-21 DIAGNOSIS — G5602 Carpal tunnel syndrome, left upper limb: Secondary | ICD-10-CM | POA: Diagnosis not present

## 2023-10-21 DIAGNOSIS — Z6838 Body mass index (BMI) 38.0-38.9, adult: Secondary | ICD-10-CM | POA: Diagnosis not present

## 2023-10-29 DIAGNOSIS — G479 Sleep disorder, unspecified: Secondary | ICD-10-CM | POA: Diagnosis not present

## 2023-10-29 DIAGNOSIS — M2550 Pain in unspecified joint: Secondary | ICD-10-CM | POA: Diagnosis not present

## 2023-10-29 DIAGNOSIS — K5900 Constipation, unspecified: Secondary | ICD-10-CM | POA: Diagnosis not present

## 2023-10-29 DIAGNOSIS — F339 Major depressive disorder, recurrent, unspecified: Secondary | ICD-10-CM | POA: Diagnosis not present

## 2023-10-29 DIAGNOSIS — I4891 Unspecified atrial fibrillation: Secondary | ICD-10-CM | POA: Diagnosis not present

## 2023-10-29 DIAGNOSIS — E291 Testicular hypofunction: Secondary | ICD-10-CM | POA: Diagnosis not present

## 2023-10-29 DIAGNOSIS — Z6839 Body mass index (BMI) 39.0-39.9, adult: Secondary | ICD-10-CM | POA: Diagnosis not present

## 2023-10-29 DIAGNOSIS — M549 Dorsalgia, unspecified: Secondary | ICD-10-CM | POA: Diagnosis not present

## 2023-11-06 DIAGNOSIS — E291 Testicular hypofunction: Secondary | ICD-10-CM | POA: Diagnosis not present

## 2023-11-06 DIAGNOSIS — Z6839 Body mass index (BMI) 39.0-39.9, adult: Secondary | ICD-10-CM | POA: Diagnosis not present

## 2023-11-11 DIAGNOSIS — G609 Hereditary and idiopathic neuropathy, unspecified: Secondary | ICD-10-CM | POA: Diagnosis not present

## 2023-11-11 DIAGNOSIS — M961 Postlaminectomy syndrome, not elsewhere classified: Secondary | ICD-10-CM | POA: Diagnosis not present

## 2023-11-11 DIAGNOSIS — Z5181 Encounter for therapeutic drug level monitoring: Secondary | ICD-10-CM | POA: Diagnosis not present

## 2023-11-13 DIAGNOSIS — Z6839 Body mass index (BMI) 39.0-39.9, adult: Secondary | ICD-10-CM | POA: Diagnosis not present

## 2023-11-13 DIAGNOSIS — E291 Testicular hypofunction: Secondary | ICD-10-CM | POA: Diagnosis not present

## 2023-11-20 DIAGNOSIS — E291 Testicular hypofunction: Secondary | ICD-10-CM | POA: Diagnosis not present

## 2023-11-20 DIAGNOSIS — Z6839 Body mass index (BMI) 39.0-39.9, adult: Secondary | ICD-10-CM | POA: Diagnosis not present

## 2023-11-27 DIAGNOSIS — G479 Sleep disorder, unspecified: Secondary | ICD-10-CM | POA: Diagnosis not present

## 2023-11-27 DIAGNOSIS — K5900 Constipation, unspecified: Secondary | ICD-10-CM | POA: Diagnosis not present

## 2023-11-27 DIAGNOSIS — Z6839 Body mass index (BMI) 39.0-39.9, adult: Secondary | ICD-10-CM | POA: Diagnosis not present

## 2023-11-27 DIAGNOSIS — E291 Testicular hypofunction: Secondary | ICD-10-CM | POA: Diagnosis not present

## 2023-12-04 DIAGNOSIS — Z6838 Body mass index (BMI) 38.0-38.9, adult: Secondary | ICD-10-CM | POA: Diagnosis not present

## 2023-12-04 DIAGNOSIS — E291 Testicular hypofunction: Secondary | ICD-10-CM | POA: Diagnosis not present

## 2024-01-08 ENCOUNTER — Other Ambulatory Visit: Payer: Self-pay

## 2024-01-08 DIAGNOSIS — D472 Monoclonal gammopathy: Secondary | ICD-10-CM

## 2024-01-14 ENCOUNTER — Inpatient Hospital Stay: Attending: Internal Medicine

## 2024-01-14 DIAGNOSIS — D649 Anemia, unspecified: Secondary | ICD-10-CM | POA: Diagnosis not present

## 2024-01-14 DIAGNOSIS — Z7982 Long term (current) use of aspirin: Secondary | ICD-10-CM | POA: Diagnosis not present

## 2024-01-14 DIAGNOSIS — D472 Monoclonal gammopathy: Secondary | ICD-10-CM | POA: Diagnosis present

## 2024-01-14 DIAGNOSIS — Z79899 Other long term (current) drug therapy: Secondary | ICD-10-CM | POA: Insufficient documentation

## 2024-01-14 LAB — CMP (CANCER CENTER ONLY)
ALT: 15 U/L (ref 0–44)
AST: 25 U/L (ref 15–41)
Albumin: 4.3 g/dL (ref 3.5–5.0)
Alkaline Phosphatase: 61 U/L (ref 38–126)
Anion gap: 6 (ref 5–15)
BUN: 17 mg/dL (ref 8–23)
CO2: 28 mmol/L (ref 22–32)
Calcium: 8.9 mg/dL (ref 8.9–10.3)
Chloride: 104 mmol/L (ref 98–111)
Creatinine: 0.91 mg/dL (ref 0.61–1.24)
GFR, Estimated: >60 mL/min (ref >60)
Glucose, Bld: 87 mg/dL (ref 70–99)
Potassium: 3.8 mmol/L (ref 3.5–5.1)
Sodium: 138 mmol/L (ref 135–145)
Total Bilirubin: 0.5 mg/dL (ref 0.0–1.2)
Total Protein: 7.9 g/dL (ref 6.5–8.1)

## 2024-01-14 LAB — CBC WITH DIFFERENTIAL (CANCER CENTER ONLY)
Abs Immature Granulocytes: 0.01 10*3/uL (ref 0.00–0.07)
Basophils Absolute: 0 10*3/uL (ref 0.0–0.1)
Basophils Relative: 0 %
Eosinophils Absolute: 0.3 10*3/uL (ref 0.0–0.5)
Eosinophils Relative: 4 %
HCT: 44.2 % (ref 39.0–52.0)
Hemoglobin: 15.4 g/dL (ref 13.0–17.0)
Immature Granulocytes: 0 %
Lymphocytes Relative: 21 %
Lymphs Abs: 1.5 10*3/uL (ref 0.7–4.0)
MCH: 31 pg (ref 26.0–34.0)
MCHC: 34.8 g/dL (ref 30.0–36.0)
MCV: 89.1 fL (ref 80.0–100.0)
Monocytes Absolute: 0.5 10*3/uL (ref 0.1–1.0)
Monocytes Relative: 7 %
Neutro Abs: 5 10*3/uL (ref 1.7–7.7)
Neutrophils Relative %: 68 %
Platelet Count: 268 10*3/uL (ref 150–400)
RBC: 4.96 MIL/uL (ref 4.22–5.81)
RDW: 12.5 % (ref 11.5–15.5)
WBC Count: 7.4 10*3/uL (ref 4.0–10.5)
nRBC: 0 % (ref 0.0–0.2)

## 2024-01-14 LAB — IRON AND IRON BINDING CAPACITY (CC-WL,HP ONLY)
Iron: 47 ug/dL (ref 45–182)
Saturation Ratios: 15 % — ABNORMAL LOW (ref 17.9–39.5)
TIBC: 319 ug/dL (ref 250–450)
UIBC: 272 ug/dL (ref 117–376)

## 2024-01-14 LAB — FERRITIN: Ferritin: 34 ng/mL (ref 24–336)

## 2024-01-15 LAB — KAPPA/LAMBDA LIGHT CHAINS
Kappa free light chain: 34.6 mg/L — ABNORMAL HIGH (ref 3.3–19.4)
Kappa, lambda light chain ratio: 1.45 (ref 0.26–1.65)
Lambda free light chains: 23.8 mg/L (ref 5.7–26.3)

## 2024-01-17 LAB — MULTIPLE MYELOMA PANEL, SERUM
Albumin SerPl Elph-Mcnc: 3.8 g/dL (ref 2.9–4.4)
Albumin/Glob SerPl: 1.2 (ref 0.7–1.7)
Alpha 1: 0.2 g/dL (ref 0.0–0.4)
Alpha2 Glob SerPl Elph-Mcnc: 0.8 g/dL (ref 0.4–1.0)
B-Globulin SerPl Elph-Mcnc: 1.2 g/dL (ref 0.7–1.3)
Gamma Glob SerPl Elph-Mcnc: 1.2 g/dL (ref 0.4–1.8)
Globulin, Total: 3.4 g/dL (ref 2.2–3.9)
IgA: 627 mg/dL — ABNORMAL HIGH (ref 61–437)
IgG (Immunoglobin G), Serum: 1385 mg/dL (ref 603–1613)
IgM (Immunoglobulin M), Srm: 44 mg/dL (ref 20–172)
M Protein SerPl Elph-Mcnc: 0.4 g/dL — ABNORMAL HIGH
Total Protein ELP: 7.2 g/dL (ref 6.0–8.5)

## 2024-01-30 NOTE — Progress Notes (Signed)
 HEMATOLOGY/ONCOLOGY CLINIC NOTE  Date of Service: 01/31/2024  Patient Care Team: Welton Hall, MD as PCP - General (Cardiology) Frankie Israel, MD as Consulting Physician (Hematology)  CHIEF COMPLAINTS/PURPOSE OF CONSULTATION:  F/u for monoclonal paraproteinemia  HISTORY OF PRESENTING ILLNESS:   Sean Arnold is a wonderful 68 y.o. male who has been referred to us  by Dr. 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 The pt reports that he is doing well overall.   The pt reports that he began having left sided foot drop in the mid 1990s which was progressive, which advanced to below the left knee. In early 2000 he noticed the same symptom in his right leg. He notes many injuries to the lumbar spine which he associates with a history of athletics. He previously rode 8000 miles a year, but not recently. He had a 4 level laminectomy in the past and is considering a complete fusion. He is scheduled for an MRI total spine in the coming weeks.  The pt also endorses a history of paroxysmal Afib. He has had 26 cardioversions. Has had a left knee replacement which went well. He had cervical fusion with C5-C6 followed by demolition. He is anticipating further cervical spine surgery and endorses stenosis with impingement.  The pt notes that he does stumble and fall. He is able to mow his lawn, but needs to pause between each row to take a seat. The pt notes that he does not sleep well due to pain with various positions. He endorses frequent napping. He was placed on opioid therapy two years ago. He has also tried Neurontin  without relief. He has used ambien  which hasn't been helpful for him. He is on a 5mg  Prednisone  daily.  The pt had a "severe reaction" to the flu shot in both 2019 and 2018. 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 He had a weakness, and pain in his right shoulder at the site of the  injection. He has been advised to forego subsequent annual flu vaccines. He endorses persisting weakness in his left arm. His upper extremity symptoms are new since 2018.  The pt notes that he has had a Ferritin level in the 400s, about 20 years ago. He pursued therapeutic phlebotomies and began feeling badly as he became anemic. 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  Most recent lab results 01/28/19 MMP revealed all values WNL except for IgA at 699, B-globulin at 1.4, and M Protein at 0.4g with IgG Lambda specificity 01/28/19 Sed Rate at 41 07/31/18 CBC revealed all values WNL except for WBC at 12.4k  On review of systems, pt reports good energy levels, weight gain, and denies concerns for infections, new back pains, and any other symptoms.   On PMHx the pt reports 20 year history of b/l foot drops, peripheral neuropathy, brachial plexopathy, gastro vertical sleeve. On Social Hx the pt reports working as a Gaffer, previously worked for [employer]. On Family Hx the pt denies cancer.  Interval History  Sean Arnold is a 68 y.o. 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  Patient was last seen by me on 11/02/2022 and reported slow progression of neuropathy in lower extremities, restless leg syndrome that radiates to entire body and  caused frequent sleep disturbances every night, and frequent bothersome fatigue.  Patient received colonoscopy on 02/14/2023 showed one 3 mm polyp in the rectum, which was removed. He received upper endoscopy on 07/24/2023.   Patient noted to have an endoscopic retrograde cholangiopancreatography with sphincterotomy/papillotomy on 12/17/2023.   Patient reports findings of Choledocholithiasis in the common bile duct and notes removal of 2 stones from common bile duct.   He  received laparoscopy, enterolysis of adhesions, mobilization of hepatic flexure on 12/18/2022 with gallbladder removal.   Patient noted that his CMP normalized almost immediately after his procedure. He had no significant pain at the time of his procedure. Patient was noted to have developed significant anemia after his procedure.  He reports that he has received  3 ablations for atrial fibrillation.  He complains of back pain and stiffness issues. Patient reports history of 5 back operations and reports having an upcoming Pedicle Subtraction Osteotomy (PSO) operation.   He reports that his movement/physical activity is limited due to his pain.   He reports that his previous back surgeries have varied in outcomes in regards to improving his back issues.   He complains of fatigue.   He reports that he is trying to lose weight and has started Zepbound.   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   MEDICAL HISTORY:  Past Medical History:  Diagnosis Date   A-fib [facility])    Anxiety    Arthritis    Brachial plexopathy 11/27/2018   Left    Complication of anesthesia    Depression    Dysrhythmia    a-Fib    ablation   Hypertension    MGUS (monoclonal gammopathy of unknown significance) 04/02/2019   IgG    Neuromuscular disorder (HCC)    neuropathy  both legs and feet   Peripheral neuropathy 04/02/2019   PONV (postoperative nausea and vomiting)    Severe  nausea   Sleep apnea    CPAP     SURGICAL HISTORY: Past Surgical History:  Procedure Laterality Date   ablation for atrial fib     x3   ACHILLES TENDON REPAIR     arthroscopic knee Bilateral    BACK SURGERY     times four   CERVICAL FUSION     CHOLECYSTECTOMY     COLONOSCOPY     LAMINECTOMY     LAPAROSCOPIC GASTRIC SLEEVE RESECTION     TOTAL KNEE ARTHROPLASTY Left 01/27/2018   Procedure: LEFT TOTAL KNEE ARTHROPLASTY;  Surgeon: Christie Cox, MD;  Location: [facility] OR;  Service: Orthopedics;   Laterality: Left;    SOCIAL HISTORY: Social History   Socioeconomic History   Marital status: Divorced    Spouse name: Not on file   Number of children: 2   Years of education: Not on file   Highest education level: Not on file  Occupational History   Not on file  Tobacco Use   Smoking status: Former    Types: Cigarettes   Smokeless tobacco: Never  Vaping Use   Vaping status: Never Used  Substance and Sexual Activity   Alcohol use: Yes    Comment: rarely   Drug use: Never   Sexual activity: Never  Other Topics Concern   Not on file  Social History Narrative   Right handed    Caffeine 5-6 cups daily   Lives at home with dog max    Social Drivers of Health   Financial Resource Strain: High Risk (06/20/2023)  Received [facility]   Overall Financial Resource Strain (CARDIA)    Difficulty of Paying Living Expenses: Hard  Food Insecurity: Low Risk  (11/27/2023)   Received from [facility]   Hunger Vital Sign    Worried About Running Out of Food in the Last Year: Never true    Ran Out of Food in the Last Year: Never true  Transportation Needs: No Transportation Needs (11/27/2023)   Received from [facility]    In the past 12 months, has lack of reliable transportation kept you from medical appointments, meetings, work or from getting things needed for daily living? : No  Physical Activity: Not on file  Stress: Not on file  Social Connections: Not on file  Intimate Partner Violence: Not on file    FAMILY HISTORY: Family History  Problem Relation Age of Onset   GI problems Mother        "bad reflux" so much so it "dissolved epiglottis"   Arthritis Mother    Depression Mother    Pneumonia Father    Atrial fibrillation Father    Alcohol abuse Father    Arthritis Father    Depression Father    Depression Sister    Arthritis Sister    Arthritis Brother    Depression Brother    Hyperlipidemia Brother    Hypertension  Brother    Colon cancer Maternal Uncle    Esophageal cancer Neg Hx    Stomach cancer Neg Hx    Rectal cancer Neg Hx    Pancreatic cancer Neg Hx     ALLERGIES:  is allergic to dofetilide, morphine and codeine, codeine, haemophilus influenzae vaccines, and influenza vac split quad.  MEDICATIONS:  Current Outpatient Medications  Medication Sig Dispense Refill   aspirin  EC 81 MG tablet Take 81 mg by mouth daily. Swallow whole.     carvedilol (COREG) 12.5 MG tablet Take 12.5 mg by mouth 2 (two) times daily.     cloNIDine (CATAPRES) 0.1 MG tablet Take 0.1 mg by mouth 2 (two) times daily.     diazepam (VALIUM) 10 MG tablet Take 10 mg by mouth every 6 (six) hours as needed for anxiety.     diclofenac (VOLTAREN) 75 MG EC tablet Take 75 mg by mouth daily.     furosemide (LASIX) 20 MG tablet Take 20 mg by mouth daily.     hydrochlorothiazide  (HYDRODIURIL ) 12.5 MG tablet Take 12.5 mg by mouth daily.     lisinopril  (ZESTRIL ) 40 MG tablet Take 40 mg by mouth daily.     Magnesium 300 MG CAPS Take 1 capsule by mouth daily as needed.     Multiple Vitamin (MULTIVITAMIN) tablet Take 1 tablet by mouth daily.     omeprazole  (PRILOSEC) 40 MG capsule TAKE 1 CAPSULE BY MOUTH DAILY 30 MINUTES BEFORE BREAKFAST 90 capsule 1   Potassium 99 MG TABS Take 1 tablet by mouth daily as needed.     SODIUM FLUORIDE 5000 PPM 1.1 % PSTE SMARTSIG:Sparingly By Mouth Daily     traMADol (ULTRAM) 50 MG tablet Take 50 mg by mouth every 6 (six) hours as needed.     No current facility-administered medications for this visit.    REVIEW OF SYSTEMS:    10 Point review of Systems was done is negative except as noted above.   PHYSICAL EXAMINATION:  Vitals:   01/31/24 1316  BP: (!) 147/80  Pulse: 66  Resp: 18  Temp: (!) 97.2 F (36.2  C)  SpO2: 96%    Filed Weights   01/31/24 1316  Weight: 284 lb 12.8 oz (129.2 kg)    Body mass index is 36.57 kg/m.   GENERAL:alert, in no acute distress and comfortable SKIN: no  acute rashes, no significant lesions EYES: conjunctiva are pink and non-injected, sclera anicteric OROPHARYNX: MMM, no exudates, no oropharyngeal erythema or ulceration NECK: supple, no JVD LYMPH:  no palpable lymphadenopathy in the cervical, axillary or inguinal regions LUNGS: clear to auscultation b/l with normal respiratory effort HEART: regular rate & rhythm ABDOMEN:  normoactive bowel sounds , non tender, not distended. Extremity: no pedal edema PSYCH: alert & oriented x 3 with fluent speech NEURO: no focal motor/sensory deficits   LABORATORY DATA:  I have reviewed the data as listed  03/09/2019 LABS Component     Latest Ref Rng & Units 03/09/2019  WBC     4.0 - 10.5 K/uL 8.3  RBC     4.22 - 5.81 MIL/uL 4.67  Hemoglobin     13.0 - 17.0 g/dL 87.5  HCT     64.3 - 32.9 % 42.2  MCV     80.0 - 100.0 fL 90.4  MCH     26.0 - 34.0 pg 31.3  MCHC     30.0 - 36.0 g/dL 51.8  RDW     84.1 - 66.0 % 12.2  Platelets     150 - 400 K/uL 220  nRBC     0.0 - 0.2 % 0.0  Neutrophils     % 73  NEUT#     1.7 - 7.7 K/uL 6.2  Lymphocytes     % 16  Lymphocyte #     0.7 - 4.0 K/uL 1.3  Monocytes Relative     % 5  Monocyte #     0.1 - 1.0 K/uL 0.4  Eosinophil     % 4  Eosinophils Absolute     0.0 - 0.5 K/uL 0.3  Basophil     % 1  Basophils Absolute     0.0 - 0.1 K/uL 0.0  Immature Granulocytes     % 1  Abs Immature Granulocytes     0.00 - 0.07 K/uL 0.04  Sodium     135 - 145 mmol/L 136  Potassium     3.5 - 5.1 mmol/L 3.9  Chloride     98 - 111 mmol/L 102  CO2     22 - 32 mmol/L 26  Glucose     70 - 99 mg/dL 97  BUN     8 - 23 mg/dL 15  Creatinine     6.30 - 1.24 mg/dL 1.60  Calcium     8.9 - 10.3 mg/dL 8.7 (L)  Total Protein     6.5 - 8.1 g/dL 7.7  Albumin     3.5 - 5.0 g/dL 3.8  AST     15 - 41 U/L 27  ALT     0 - 44 U/L 22  Alkaline Phosphatase     38 - 126 U/L 54  Total Bilirubin     0.3 - 1.2 mg/dL 0.5  GFR, Est Non African American     >60 mL/min  >60  GFR, Est African American     >60 mL/min >60  Anion gap     5 - 15 8  IgG (Immunoglobin G), Serum     603 - 1,613 mg/dL 1,093  IgA     61 - 235 mg/dL 573 (  H)  IgM (Immunoglobulin M), Srm     20 - 172 mg/dL 47  Total Protein ELP     6.0 - 8.5 g/dL 7.1  Albumin SerPl Elph-Mcnc     2.9 - 4.4 g/dL 3.8  Alpha 1     0.0 - 0.4 g/dL 0.2  Alpha2 Glob SerPl Elph-Mcnc     0.4 - 1.0 g/dL 0.9  B-Globulin SerPl Elph-Mcnc     0.7 - 1.3 g/dL 1.1  Gamma Glob SerPl Elph-Mcnc     0.4 - 1.8 g/dL 1.1  M Protein SerPl Elph-Mcnc     Not Observed g/dL 0.4 (H)  Globulin, Total     2.2 - 3.9 g/dL 3.3  Albumin/Glob SerPl     0.7 - 1.7 1.2  IFE 1      Comment  Please Note (HCV):      Comment  Iron     42 - 163 ug/dL 89  TIBC     161 - 096 ug/dL 045  Saturation Ratios     20 - 55 % 33  UIBC     117 - 376 ug/dL 409  Kappa free light chain     3.3 - 19.4 mg/L 23.8 (H)  Lamda free light chains     5.7 - 26.3 mg/L 19.0  Kappa, lamda light chain ratio     0.26 - 1.65 1.25  Beta-2  Microglobulin     0.6 - 2.4 mg/L 1.8  Ferritin     24 - 336 ng/mL 72  DNA Mutation Analysis      Comment  Sed Rate     0 - 16 mm/hr 14    .    Latest Ref Rng & Units 01/14/2024    1:56 PM 06/21/2023    4:12 PM 11/02/2022    3:24 PM  CBC  WBC 4.0 - 10.5 K/uL 7.4  9.3  8.2   Hemoglobin 13.0 - 17.0 g/dL 81.1  91.4  78.2   Hematocrit 39.0 - 52.0 % 44.2  44.0  44.3   Platelets 150 - 400 K/uL 268  298.0  270     .    Latest Ref Rng & Units 01/14/2024    1:56 PM 11/02/2022    3:24 PM 10/03/2020    4:31 PM  CMP  Glucose 70 - 99 mg/dL 87  90  76   BUN 8 - 23 mg/dL 17  15  13    Creatinine 0.61 - 1.24 mg/dL 9.56  2.13  0.86   Sodium 135 - 145 mmol/L 138  137  135   Potassium 3.5 - 5.1 mmol/L 3.8  3.6  3.8   Chloride 98 - 111 mmol/L 104  101  101   CO2 22 - 32 mmol/L 28  27  28    Calcium 8.9 - 10.3 mg/dL 8.9  9.0  9.3   Total Protein 6.5 - 8.1 g/dL 7.9  8.0  8.2   Total Bilirubin 0.0 - 1.2 mg/dL 0.5   1.3  0.5   Alkaline Phos 38 - 126 U/L 61  161  62   AST 15 - 41 U/L 25  87  23   ALT 0 - 44 U/L 15  82  15    Hemochromatosis DNA, PCR Order: [id number] Status:  Final result   Visible to patient:  Yes (MyChart) Next appt:  04/21/2019 at 12:50 PM in Radiology (GI-315 MR 1) Dx:  Monoclonal paraproteinemia; Hemochrom... Component 74mo ago  DNA Mutation Analysis Comment  Comment: (NOTE)  Result:  AFFECTED  Two copies of the same mutation (C282Y and C282Y) identified       . Lab Results  Component Value Date   IRON 47 01/14/2024   TIBC 319 01/14/2024   IRONPCTSAT 15 (L) 01/14/2024   (Iron and TIBC)  Lab Results  Component Value Date   FERRITIN 34 01/14/2024   Component     Latest Ref Rng 11/02/2022  Testosterone      264 - 916 ng/dL 161 (L)   Testosterone  Free     6.6 - 18.1 pg/mL 2.7 (L)   Sex Horm Binding Glob, Serum     19.3 - 76.4 nmol/L 24.9   Sed Rate     0 - 16 mm/hr 42 (H)   Copper      69 - 132 ug/dL 096 (H)     Legend: (L) Low (H) High  RADIOGRAPHIC STUDIES: I have personally reviewed the radiological images as listed and agreed with the findings in the report. No results found.  ASSESSMENT & PLAN:  68 y.o. male with  1. 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  2. 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 Lab Results  Component Value Date   IRON 47 01/14/2024   TIBC 319 01/14/2024   IRONPCTSAT 15 (L) 01/14/2024   (Iron and TIBC)  Lab Results  Component Value Date   FERRITIN 34 01/14/2024  PLAN -will check iron labs today and address need for therapeutic phlebotomy accordingly. -will monitor - goal would be to keep ferritin levels under 100 and iron saturation<50%  3. +ve cologard testing -patient has received colonoscopy  PLAN: -Discussed lab results from 01/14/2024 in detail  with patient. CBC normal, showed WBC of 7.4K, hemoglobin of 15.4, and platelets of 268K. -hgb improved from 14.8 seven months ago to 15.4 -CMP shows normal kidney function, calcium level, and liver function -his previous liver abnormality has resolved -myeloma panel from 01/14/2024 shows very stable M protein of 0.4 g/dL since 0454 -kappa free light chains are stable at 34.6 -K/L ratio is normal at 1.45, which is reassuring -At this time, patient does not meet CRAB criteria and his condition is not affecting more than 60% of the bone marrow.  -there is no clinical sign or lab evidence of progression of MGUS plasma cell disorder -his iron level is well-controlled without phlebotomy -iron saturation decreased from 72% one year ago to 15% two weeks ago -ferritin previously higher at 173 one year ago, decreased to 34 two weeks ago.  -discussed that drop in iron is likely from his recent surgeries and GI losses from polyp -there is no indication for phlebotomy to treat hereditary hemochromatosis at this time -do not recommend patient to take iron supplement at this time -Patient reports having positive TP53 on peripheral blood testing with PCP -Reviewed his recent lab results with PCP  -discussed that it is difficult to interpret positive TP53 testing on peripheral blood test without context -Discussed that there is some uncertainty in regards to whether this is a germline finding or based on subclonal population or if it is even related to plasma cells. Educated patient of rare genetic disorder of Li-Fraumeni syndrome, which is assicated with several cancers. -discussed that with age, stem cells may develop mutations without translating to anything in particular -Discussed that the TP53 mutation may suggest he has a clone that has that mutation, which is  not signifiacnt at this time.  -educated patient on the function of TP53 gene, which actively repairs DNA to prevent production of cancer cells.  Discussed that if this gene is not working well, it can increase the risk of cancer.  -patient shall return to clinic with us  in 1 year   FOLLOW-UP: Phone visit with Dr Salomon Cree in 12 months Labs 1-2 weeks prior to phone visit  The total time spent in the appointment was 30 minutes* .  All of the patient's questions were answered with apparent satisfaction. The patient knows to call the clinic with any problems, questions or concerns.   Jacquelyn Matt MD MS AAHIVMS [facility] Hematology/Oncology Physician [facility]  .*Total Encounter Time as defined by the Centers for Medicare and Medicaid Services includes, in addition to the face-to-face time of a patient visit (documented in the note above) non-face-to-face time: obtaining and reviewing outside history, ordering and reviewing medications, tests or procedures, care coordination (communications with other health care professionals or caregivers) and documentation in the medical record.    I,Mitra Faeizi,acting as a Neurosurgeon for Jacquelyn Matt, MD.,have documented all relevant documentation on the behalf of Jacquelyn Matt, MD,as directed by  Jacquelyn Matt, MD while in the presence of Jacquelyn Matt, MD.   .I have reviewed the above documentation for accuracy and completeness, and I agree with the above. .Jaedyn Lard Kishore Kimiye Strathman MD

## 2024-01-31 ENCOUNTER — Inpatient Hospital Stay (HOSPITAL_BASED_OUTPATIENT_CLINIC_OR_DEPARTMENT_OTHER): Admitting: Hematology

## 2024-01-31 VITALS — BP 147/80 | HR 66 | Temp 97.2°F | Resp 18 | Wt 284.8 lb

## 2024-01-31 DIAGNOSIS — D472 Monoclonal gammopathy: Secondary | ICD-10-CM | POA: Diagnosis not present

## 2025-01-20 ENCOUNTER — Other Ambulatory Visit

## 2025-02-03 ENCOUNTER — Telehealth: Admitting: Hematology
